# Patient Record
Sex: Female | Born: 1980
Health system: Southern US, Community
[De-identification: ages and names within clinical notes are randomized; demographics above are authoritative.]

## PROBLEM LIST (undated history)

## (undated) DIAGNOSIS — D229 Melanocytic nevi, unspecified: Secondary | ICD-10-CM

## (undated) DIAGNOSIS — Z8719 Personal history of other diseases of the digestive system: Secondary | ICD-10-CM

## (undated) DIAGNOSIS — Z8669 Personal history of other diseases of the nervous system and sense organs: Secondary | ICD-10-CM

## (undated) DIAGNOSIS — R112 Nausea with vomiting, unspecified: Secondary | ICD-10-CM

## (undated) DIAGNOSIS — Z9889 Other specified postprocedural states: Secondary | ICD-10-CM

## (undated) DIAGNOSIS — Z862 Personal history of diseases of the blood and blood-forming organs and certain disorders involving the immune mechanism: Secondary | ICD-10-CM

## (undated) DIAGNOSIS — K8050 Calculus of bile duct without cholangitis or cholecystitis without obstruction: Secondary | ICD-10-CM

## (undated) DIAGNOSIS — K219 Gastro-esophageal reflux disease without esophagitis: Secondary | ICD-10-CM

## (undated) DIAGNOSIS — Z87898 Personal history of other specified conditions: Secondary | ICD-10-CM

## (undated) HISTORY — PX: SHOULDER ARTHROSCOPY W/ LABRAL REPAIR: SHX2399

## (undated) HISTORY — DX: Gastro-esophageal reflux disease without esophagitis: K21.9

## (undated) HISTORY — PX: ABLATION: SHX5711

## (undated) HISTORY — PX: ARTHROSCOPY, HIP, WITH LABRUM REPAIR: SHX7156

## (undated) HISTORY — PX: BREAST SURGERY: SHX581

---
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
## 2000-02-07 DIAGNOSIS — K219 Gastro-esophageal reflux disease without esophagitis: Secondary | ICD-10-CM | POA: Insufficient documentation

## 2000-05-20 HISTORY — PX: TONSILLECTOMY: SUR1361

## 2000-06-09 ENCOUNTER — Other Ambulatory Visit: Admission: RE | Admit: 2000-06-09 | Discharge: 2000-06-09 | Payer: Self-pay | Admitting: *Deleted

## 2001-06-26 ENCOUNTER — Other Ambulatory Visit: Admission: RE | Admit: 2001-06-26 | Discharge: 2001-06-26 | Payer: Self-pay | Admitting: *Deleted

## 2002-07-08 ENCOUNTER — Other Ambulatory Visit: Admission: RE | Admit: 2002-07-08 | Discharge: 2002-07-08 | Payer: Self-pay | Admitting: Obstetrics and Gynecology

## 2003-03-31 ENCOUNTER — Encounter: Admission: RE | Admit: 2003-03-31 | Discharge: 2003-06-29 | Payer: Self-pay | Admitting: Orthopedic Surgery

## 2003-04-01 ENCOUNTER — Ambulatory Visit (HOSPITAL_COMMUNITY): Admission: RE | Admit: 2003-04-01 | Discharge: 2003-04-01 | Payer: Self-pay | Admitting: Orthopedic Surgery

## 2003-06-30 ENCOUNTER — Encounter: Admission: RE | Admit: 2003-06-30 | Discharge: 2003-09-28 | Payer: Self-pay | Admitting: Orthopedic Surgery

## 2003-07-19 ENCOUNTER — Other Ambulatory Visit: Admission: RE | Admit: 2003-07-19 | Discharge: 2003-07-19 | Payer: Self-pay | Admitting: Obstetrics and Gynecology

## 2003-10-04 ENCOUNTER — Encounter: Admission: RE | Admit: 2003-10-04 | Discharge: 2003-10-04 | Payer: Self-pay | Admitting: Orthopedic Surgery

## 2003-10-27 ENCOUNTER — Encounter: Admission: RE | Admit: 2003-10-27 | Discharge: 2003-11-16 | Payer: Self-pay | Admitting: Orthopedic Surgery

## 2005-08-31 ENCOUNTER — Ambulatory Visit (HOSPITAL_COMMUNITY): Admission: RE | Admit: 2005-08-31 | Discharge: 2005-08-31 | Payer: Self-pay | Admitting: Neurosurgery

## 2006-06-12 ENCOUNTER — Inpatient Hospital Stay (HOSPITAL_COMMUNITY): Admission: AD | Admit: 2006-06-12 | Discharge: 2006-06-12 | Payer: Self-pay | Admitting: Obstetrics and Gynecology

## 2006-06-28 ENCOUNTER — Inpatient Hospital Stay (HOSPITAL_COMMUNITY): Admission: AD | Admit: 2006-06-28 | Discharge: 2006-06-28 | Payer: Self-pay | Admitting: Obstetrics and Gynecology

## 2006-08-19 ENCOUNTER — Inpatient Hospital Stay (HOSPITAL_COMMUNITY): Admission: AD | Admit: 2006-08-19 | Discharge: 2006-08-21 | Payer: Self-pay | Admitting: Obstetrics and Gynecology

## 2008-01-26 ENCOUNTER — Emergency Department (HOSPITAL_COMMUNITY): Admission: EM | Admit: 2008-01-26 | Discharge: 2008-01-26 | Payer: Self-pay | Admitting: *Deleted

## 2008-02-04 ENCOUNTER — Ambulatory Visit (HOSPITAL_COMMUNITY): Admission: RE | Admit: 2008-02-04 | Discharge: 2008-02-04 | Payer: Self-pay | Admitting: Gastroenterology

## 2008-08-02 ENCOUNTER — Ambulatory Visit (HOSPITAL_COMMUNITY): Admission: RE | Admit: 2008-08-02 | Discharge: 2008-08-02 | Payer: Self-pay

## 2008-08-06 ENCOUNTER — Emergency Department (HOSPITAL_COMMUNITY): Admission: EM | Admit: 2008-08-06 | Discharge: 2008-08-06 | Payer: Self-pay | Admitting: Family Medicine

## 2009-02-12 ENCOUNTER — Inpatient Hospital Stay (HOSPITAL_COMMUNITY): Admission: AD | Admit: 2009-02-12 | Discharge: 2009-02-12 | Payer: Self-pay | Admitting: Obstetrics

## 2009-03-20 ENCOUNTER — Ambulatory Visit: Payer: Self-pay | Admitting: Oncology

## 2009-03-21 LAB — CHCC SMEAR

## 2009-03-21 LAB — COMPREHENSIVE METABOLIC PANEL
ALT: 9 U/L (ref 0–35)
AST: 14 U/L (ref 0–37)
Albumin: 3.8 g/dL (ref 3.5–5.2)
Alkaline Phosphatase: 108 U/L (ref 39–117)
BUN: 7 mg/dL (ref 6–23)
CO2: 19 mEq/L (ref 19–32)
Calcium: 8.4 mg/dL (ref 8.4–10.5)
Chloride: 105 mEq/L (ref 96–112)
Creatinine, Ser: 0.6 mg/dL (ref 0.40–1.20)
Glucose, Bld: 81 mg/dL (ref 70–99)
Potassium: 3.6 mEq/L (ref 3.5–5.3)
Sodium: 137 mEq/L (ref 135–145)
Total Bilirubin: 0.3 mg/dL (ref 0.3–1.2)
Total Protein: 6.4 g/dL (ref 6.0–8.3)

## 2009-03-21 LAB — CBC WITH DIFFERENTIAL/PLATELET
BASO%: 0.2 % (ref 0.0–2.0)
Basophils Absolute: 0 10*3/uL (ref 0.0–0.1)
EOS%: 0.8 % (ref 0.0–7.0)
Eosinophils Absolute: 0.1 10*3/uL (ref 0.0–0.5)
HCT: 34.9 % (ref 34.8–46.6)
HGB: 12.1 g/dL (ref 11.6–15.9)
LYMPH%: 22.2 % (ref 14.0–49.7)
MCH: 32.2 pg (ref 25.1–34.0)
MCHC: 34.6 g/dL (ref 31.5–36.0)
MCV: 93.2 fL (ref 79.5–101.0)
MONO#: 0.6 10*3/uL (ref 0.1–0.9)
MONO%: 6.1 % (ref 0.0–14.0)
NEUT#: 7.1 10*3/uL — ABNORMAL HIGH (ref 1.5–6.5)
NEUT%: 70.7 % (ref 38.4–76.8)
Platelets: 143 10*3/uL — ABNORMAL LOW (ref 145–400)
RBC: 3.74 10*6/uL (ref 3.70–5.45)
RDW: 13 % (ref 11.2–14.5)
WBC: 10 10*3/uL (ref 3.9–10.3)
lymph#: 2.2 10*3/uL (ref 0.9–3.3)

## 2009-03-21 LAB — APTT: aPTT: 26 seconds (ref 24–37)

## 2009-03-21 LAB — LACTATE DEHYDROGENASE: LDH: 158 U/L (ref 94–250)

## 2009-03-21 LAB — PROTIME-INR
INR: 0.9 — ABNORMAL LOW (ref 2.00–3.50)
Protime: 10.8 Seconds (ref 10.6–13.4)

## 2009-03-21 LAB — TECHNOLOGIST REVIEW

## 2009-03-22 LAB — ANA: Anti Nuclear Antibody(ANA): NEGATIVE

## 2009-04-12 LAB — CBC WITH DIFFERENTIAL/PLATELET
BASO%: 0.2 % (ref 0.0–2.0)
Basophils Absolute: 0 10*3/uL (ref 0.0–0.1)
EOS%: 1.9 % (ref 0.0–7.0)
Eosinophils Absolute: 0.2 10*3/uL (ref 0.0–0.5)
HCT: 33.6 % — ABNORMAL LOW (ref 34.8–46.6)
HGB: 11.5 g/dL — ABNORMAL LOW (ref 11.6–15.9)
LYMPH%: 31.1 % (ref 14.0–49.7)
MCH: 31.2 pg (ref 25.1–34.0)
MCHC: 34.2 g/dL (ref 31.5–36.0)
MCV: 91.1 fL (ref 79.5–101.0)
MONO#: 1 10*3/uL — ABNORMAL HIGH (ref 0.1–0.9)
MONO%: 8 % (ref 0.0–14.0)
NEUT#: 7.3 10*3/uL — ABNORMAL HIGH (ref 1.5–6.5)
NEUT%: 58.8 % (ref 38.4–76.8)
Platelets: 110 10*3/uL — ABNORMAL LOW (ref 145–400)
RBC: 3.69 10*6/uL — ABNORMAL LOW (ref 3.70–5.45)
RDW: 13.3 % (ref 11.2–14.5)
WBC: 12.3 10*3/uL — ABNORMAL HIGH (ref 3.9–10.3)
lymph#: 3.8 10*3/uL — ABNORMAL HIGH (ref 0.9–3.3)

## 2009-04-12 LAB — TECHNOLOGIST REVIEW

## 2009-04-20 ENCOUNTER — Ambulatory Visit: Payer: Self-pay | Admitting: Oncology

## 2009-04-24 LAB — CBC WITH DIFFERENTIAL/PLATELET
BASO%: 0.2 % (ref 0.0–2.0)
Basophils Absolute: 0 10*3/uL (ref 0.0–0.1)
EOS%: 1.1 % (ref 0.0–7.0)
Eosinophils Absolute: 0.1 10*3/uL (ref 0.0–0.5)
HCT: 36 % (ref 34.8–46.6)
HGB: 12.2 g/dL (ref 11.6–15.9)
LYMPH%: 21.7 % (ref 14.0–49.7)
MCH: 31.3 pg (ref 25.1–34.0)
MCHC: 33.9 g/dL (ref 31.5–36.0)
MCV: 92.3 fL (ref 79.5–101.0)
MONO#: 0.7 10*3/uL (ref 0.1–0.9)
MONO%: 6.3 % (ref 0.0–14.0)
NEUT#: 8.1 10*3/uL — ABNORMAL HIGH (ref 1.5–6.5)
NEUT%: 70.7 % (ref 38.4–76.8)
Platelets: 80 10*3/uL — ABNORMAL LOW (ref 145–400)
RBC: 3.9 10*6/uL (ref 3.70–5.45)
RDW: 13.6 % (ref 11.2–14.5)
WBC: 11.5 10*3/uL — ABNORMAL HIGH (ref 3.9–10.3)
lymph#: 2.5 10*3/uL (ref 0.9–3.3)
nRBC: 0 % (ref 0–0)

## 2009-05-02 LAB — BASIC METABOLIC PANEL
BUN: 9 mg/dL (ref 6–23)
CO2: 22 mEq/L (ref 19–32)
Calcium: 8.9 mg/dL (ref 8.4–10.5)
Chloride: 103 mEq/L (ref 96–112)
Creatinine, Ser: 0.66 mg/dL (ref 0.40–1.20)
Glucose, Bld: 112 mg/dL — ABNORMAL HIGH (ref 70–99)
Potassium: 3.9 mEq/L (ref 3.5–5.3)
Sodium: 136 mEq/L (ref 135–145)

## 2009-05-02 LAB — CBC WITH DIFFERENTIAL/PLATELET
BASO%: 0.2 % (ref 0.0–2.0)
Basophils Absolute: 0 10*3/uL (ref 0.0–0.1)
EOS%: 0.4 % (ref 0.0–7.0)
Eosinophils Absolute: 0.1 10*3/uL (ref 0.0–0.5)
HCT: 35.1 % (ref 34.8–46.6)
HGB: 12 g/dL (ref 11.6–15.9)
LYMPH%: 16.1 % (ref 14.0–49.7)
MCH: 32.3 pg (ref 25.1–34.0)
MCHC: 34.2 g/dL (ref 31.5–36.0)
MCV: 94.6 fL (ref 79.5–101.0)
MONO#: 1.2 10*3/uL — ABNORMAL HIGH (ref 0.1–0.9)
MONO%: 7.9 % (ref 0.0–14.0)
NEUT#: 11.6 10*3/uL — ABNORMAL HIGH (ref 1.5–6.5)
NEUT%: 75.4 % (ref 38.4–76.8)
Platelets: 157 10*3/uL (ref 145–400)
RBC: 3.71 10*6/uL (ref 3.70–5.45)
RDW: 13.3 % (ref 11.2–14.5)
WBC: 15.3 10*3/uL — ABNORMAL HIGH (ref 3.9–10.3)
lymph#: 2.5 10*3/uL (ref 0.9–3.3)

## 2009-05-08 LAB — CBC WITH DIFFERENTIAL/PLATELET
BASO%: 0.2 % (ref 0.0–2.0)
Basophils Absolute: 0 10*3/uL (ref 0.0–0.1)
EOS%: 0.3 % (ref 0.0–7.0)
Eosinophils Absolute: 0 10*3/uL (ref 0.0–0.5)
HCT: 40.5 % (ref 34.8–46.6)
HGB: 13.9 g/dL (ref 11.6–15.9)
LYMPH%: 15 % (ref 14.0–49.7)
MCH: 32.4 pg (ref 25.1–34.0)
MCHC: 34.3 g/dL (ref 31.5–36.0)
MCV: 94.5 fL (ref 79.5–101.0)
MONO#: 0.6 10*3/uL (ref 0.1–0.9)
MONO%: 4.9 % (ref 0.0–14.0)
NEUT#: 9.8 10*3/uL — ABNORMAL HIGH (ref 1.5–6.5)
NEUT%: 79.6 % — ABNORMAL HIGH (ref 38.4–76.8)
Platelets: 181 10*3/uL (ref 145–400)
RBC: 4.29 10*6/uL (ref 3.70–5.45)
RDW: 13.6 % (ref 11.2–14.5)
WBC: 12.3 10*3/uL — ABNORMAL HIGH (ref 3.9–10.3)
lymph#: 1.8 10*3/uL (ref 0.9–3.3)

## 2009-05-08 LAB — BASIC METABOLIC PANEL
BUN: 10 mg/dL (ref 6–23)
CO2: 22 mEq/L (ref 19–32)
Calcium: 9.5 mg/dL (ref 8.4–10.5)
Chloride: 101 mEq/L (ref 96–112)
Creatinine, Ser: 0.77 mg/dL (ref 0.40–1.20)
Glucose, Bld: 123 mg/dL — ABNORMAL HIGH (ref 70–99)
Potassium: 4 mEq/L (ref 3.5–5.3)
Sodium: 135 mEq/L (ref 135–145)

## 2009-05-17 LAB — CBC WITH DIFFERENTIAL/PLATELET
BASO%: 0.2 % (ref 0.0–2.0)
Basophils Absolute: 0 10*3/uL (ref 0.0–0.1)
EOS%: 0.6 % (ref 0.0–7.0)
Eosinophils Absolute: 0.1 10*3/uL (ref 0.0–0.5)
HCT: 41.5 % (ref 34.8–46.6)
HGB: 14.3 g/dL (ref 11.6–15.9)
LYMPH%: 21.5 % (ref 14.0–49.7)
MCH: 32.5 pg (ref 25.1–34.0)
MCHC: 34.4 g/dL (ref 31.5–36.0)
MCV: 94.5 fL (ref 79.5–101.0)
MONO#: 0.5 10*3/uL (ref 0.1–0.9)
MONO%: 5.2 % (ref 0.0–14.0)
NEUT#: 7.2 10*3/uL — ABNORMAL HIGH (ref 1.5–6.5)
NEUT%: 72.5 % (ref 38.4–76.8)
Platelets: 159 10*3/uL (ref 145–400)
RBC: 4.39 10*6/uL (ref 3.70–5.45)
RDW: 13.8 % (ref 11.2–14.5)
WBC: 9.9 10*3/uL (ref 3.9–10.3)
lymph#: 2.1 10*3/uL (ref 0.9–3.3)

## 2009-05-17 LAB — BASIC METABOLIC PANEL
BUN: 11 mg/dL (ref 6–23)
CO2: 19 mEq/L (ref 19–32)
Calcium: 9 mg/dL (ref 8.4–10.5)
Chloride: 103 mEq/L (ref 96–112)
Creatinine, Ser: 0.71 mg/dL (ref 0.40–1.20)
Glucose, Bld: 100 mg/dL — ABNORMAL HIGH (ref 70–99)
Potassium: 4.2 mEq/L (ref 3.5–5.3)
Sodium: 137 mEq/L (ref 135–145)

## 2009-05-18 ENCOUNTER — Inpatient Hospital Stay (HOSPITAL_COMMUNITY): Admission: RE | Admit: 2009-05-18 | Discharge: 2009-05-19 | Payer: Self-pay | Admitting: Obstetrics and Gynecology

## 2009-05-22 ENCOUNTER — Ambulatory Visit: Payer: Self-pay | Admitting: Oncology

## 2009-05-22 LAB — CBC WITH DIFFERENTIAL/PLATELET
BASO%: 0.1 % (ref 0.0–2.0)
Basophils Absolute: 0 10*3/uL (ref 0.0–0.1)
EOS%: 0.7 % (ref 0.0–7.0)
Eosinophils Absolute: 0.1 10*3/uL (ref 0.0–0.5)
HCT: 40.5 % (ref 34.8–46.6)
HGB: 13.8 g/dL (ref 11.6–15.9)
LYMPH%: 16.8 % (ref 14.0–49.7)
MCH: 31.7 pg (ref 25.1–34.0)
MCHC: 34.1 g/dL (ref 31.5–36.0)
MCV: 92.9 fL (ref 79.5–101.0)
MONO#: 0.5 10*3/uL (ref 0.1–0.9)
MONO%: 5.4 % (ref 0.0–14.0)
NEUT#: 7.8 10*3/uL — ABNORMAL HIGH (ref 1.5–6.5)
NEUT%: 77 % — ABNORMAL HIGH (ref 38.4–76.8)
Platelets: 119 10*3/uL — ABNORMAL LOW (ref 145–400)
RBC: 4.36 10*6/uL (ref 3.70–5.45)
RDW: 13.6 % (ref 11.2–14.5)
WBC: 10.1 10*3/uL (ref 3.9–10.3)
lymph#: 1.7 10*3/uL (ref 0.9–3.3)

## 2009-06-05 LAB — CBC WITH DIFFERENTIAL/PLATELET
BASO%: 0.7 % (ref 0.0–2.0)
Basophils Absolute: 0.1 10*3/uL (ref 0.0–0.1)
EOS%: 2.2 % (ref 0.0–7.0)
Eosinophils Absolute: 0.2 10*3/uL (ref 0.0–0.5)
HCT: 44.7 % (ref 34.8–46.6)
HGB: 14.4 g/dL (ref 11.6–15.9)
LYMPH%: 47.4 % (ref 14.0–49.7)
MCH: 30.8 pg (ref 25.1–34.0)
MCHC: 32.2 g/dL (ref 31.5–36.0)
MCV: 95.5 fL (ref 79.5–101.0)
MONO#: 0.6 10*3/uL (ref 0.1–0.9)
MONO%: 7 % (ref 0.0–14.0)
NEUT#: 3.9 10*3/uL (ref 1.5–6.5)
NEUT%: 42.7 % (ref 38.4–76.8)
Platelets: 131 10*3/uL — ABNORMAL LOW (ref 145–400)
RBC: 4.68 10*6/uL (ref 3.70–5.45)
RDW: 13 % (ref 11.2–14.5)
WBC: 9.2 10*3/uL (ref 3.9–10.3)
lymph#: 4.4 10*3/uL — ABNORMAL HIGH (ref 0.9–3.3)
nRBC: 0 % (ref 0–0)

## 2009-06-12 LAB — CBC WITH DIFFERENTIAL/PLATELET
BASO%: 0.2 % (ref 0.0–2.0)
Basophils Absolute: 0 10*3/uL (ref 0.0–0.1)
EOS%: 1.2 % (ref 0.0–7.0)
Eosinophils Absolute: 0.2 10*3/uL (ref 0.0–0.5)
HCT: 46.5 % (ref 34.8–46.6)
HGB: 15.2 g/dL (ref 11.6–15.9)
LYMPH%: 31.5 % (ref 14.0–49.7)
MCH: 31 pg (ref 25.1–34.0)
MCHC: 32.7 g/dL (ref 31.5–36.0)
MCV: 94.7 fL (ref 79.5–101.0)
MONO#: 1 10*3/uL — ABNORMAL HIGH (ref 0.1–0.9)
MONO%: 7.3 % (ref 0.0–14.0)
NEUT#: 8 10*3/uL — ABNORMAL HIGH (ref 1.5–6.5)
NEUT%: 59.8 % (ref 38.4–76.8)
Platelets: 143 10*3/uL — ABNORMAL LOW (ref 145–400)
RBC: 4.91 10*6/uL (ref 3.70–5.45)
RDW: 12.7 % (ref 11.2–14.5)
WBC: 13.3 10*3/uL — ABNORMAL HIGH (ref 3.9–10.3)
lymph#: 4.2 10*3/uL — ABNORMAL HIGH (ref 0.9–3.3)

## 2009-06-22 ENCOUNTER — Ambulatory Visit: Payer: Self-pay | Admitting: Oncology

## 2010-08-20 LAB — CBC
HCT: 40.7 % (ref 36.0–46.0)
HCT: 42.1 % (ref 36.0–46.0)
Hemoglobin: 13.7 g/dL (ref 12.0–15.0)
Hemoglobin: 14.1 g/dL (ref 12.0–15.0)
MCHC: 33.4 g/dL (ref 30.0–36.0)
MCHC: 33.6 g/dL (ref 30.0–36.0)
MCV: 94.8 fL (ref 78.0–100.0)
MCV: 95.8 fL (ref 78.0–100.0)
Platelets: 112 10*3/uL — ABNORMAL LOW (ref 150–400)
Platelets: 157 10*3/uL (ref 150–400)
RBC: 4.25 MIL/uL (ref 3.87–5.11)
RBC: 4.44 MIL/uL (ref 3.87–5.11)
RDW: 13.7 % (ref 11.5–15.5)
RDW: 13.9 % (ref 11.5–15.5)
WBC: 10.6 10*3/uL — ABNORMAL HIGH (ref 4.0–10.5)
WBC: 12.7 10*3/uL — ABNORMAL HIGH (ref 4.0–10.5)

## 2010-08-20 LAB — RPR: RPR Ser Ql: NONREACTIVE

## 2010-08-24 LAB — URINALYSIS, ROUTINE W REFLEX MICROSCOPIC
Bilirubin Urine: NEGATIVE
Glucose, UA: NEGATIVE mg/dL
Ketones, ur: NEGATIVE mg/dL
Leukocytes, UA: NEGATIVE
Nitrite: NEGATIVE
Protein, ur: NEGATIVE mg/dL
Specific Gravity, Urine: <1.005 — ABNORMAL LOW (ref 1.005–1.030)
Urobilinogen, UA: 0.2 mg/dL (ref 0.0–1.0)
pH: 5.5 (ref 5.0–8.0)

## 2010-08-24 LAB — URINE MICROSCOPIC-ADD ON
RBC / HPF: NONE SEEN RBC/hpf (ref <3)
WBC, UA: NONE SEEN WBC/hpf (ref <3)

## 2010-08-30 LAB — POCT URINALYSIS DIP (DEVICE)
Bilirubin Urine: NEGATIVE
Glucose, UA: NEGATIVE mg/dL
Hgb urine dipstick: NEGATIVE
Ketones, ur: NEGATIVE mg/dL
Nitrite: NEGATIVE
Protein, ur: NEGATIVE mg/dL
Specific Gravity, Urine: <=1.005 (ref 1.005–1.030)
Urobilinogen, UA: 0.2 mg/dL (ref 0.0–1.0)
pH: 6 (ref 5.0–8.0)

## 2010-08-30 LAB — POCT PREGNANCY, URINE: Preg Test, Ur: NEGATIVE

## 2010-10-02 NOTE — Op Note (Signed)
Rebecca Pearson, Rebecca Pearson               ACCOUNT NO.:  [id number]   MEDICAL RECORD NO.:  [id number]          PATIENT TYPE:  AMB   LOCATION:  ENDO                         FACILITY:  [facility]   PHYSICIAN:  Bernette Redbird, M.D.   DATE OF BIRTH:  06-16-1980   DATE OF PROCEDURE:  02/04/2008  DATE OF DISCHARGE:                               OPERATIVE REPORT   PROCEDURE:  Upper endoscopy with Bravo capsule placement.   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  Because the character of the  symptoms is atypical for GERD, this procedure is being done with her on  Protonix to look for evidence of breakthrough reflux as a possible cause  of her symptoms.   FINDINGS:  Moderate retained food despite 9-hour fast.  Successful  deployment of Bravo capsule.   DESCRIPTION OF PROCEDURE:  The nature, purpose and risks of the  procedure had been discussed with the patient who provided written  consent.  She came as an outpatient to the endoscopy unit.  She had been  fasting for 9 hours prior to the procedure and worked the night shift  prior to this examination.   Sedation was fentanyl 100 mcg, Versed 10 mg and Cetacaine spray for  topical pharyngeal anesthesia.  The intravenous sedation was  administered prior to and during the course of the procedure and did not  result in any clinical instability such as desaturation or arrhythmias.   The Pentax adult video endoscope was passed under direct vision.  The  larynx and vocal cords looked normal.  The esophagus was readily  entered.   The esophageal mucosa was entirely normal.  I did not see any free  reflux, reflux esophagitis, Barrett's esophagus, varices, infection,  neoplasia or any ring or stricture.   A minimal (1-2 cm) hiatal hernia was present.   The stomach was entered.  It was notable for a moderate amount of  retained food, including vegetable debris such as chili beans and onion  skins.  It is estimated that the amount of retained food was probably on  the order of a quarter of a cup or perhaps half a cup; that is, the  stomach was not filled with food.   The gastric mucosa was normal in all locations visualized, including a  retroflexed view of the cardia, but the retained food did obscure  portions of the greater curve, so focal lesions might have been missed.   The pylorus was widely patent and the duodenal bulb and the second  duodenum looked normal.   Bravo capsule was then performed.  The lower esophageal sphincter was  measured at 39 cm, so the Bravo capsule was placed at 33 cm.  After  removing the scope, the Bravo capsule deployment device was passed  blindly into the esophagus and its intra-esophageal location was  confirmed endoscopically.  Suction was then applied for about 40 seconds  and the capsule was successfully deployed.  The patient was re-  endoscoped  under direct vision, confirming successful attachment of the  capsule to the esophageal wall.  The scope was then removed from the  patient who tolerated the procedure well and without apparent  complication.   IMPRESSION:  1. Reflux, without any adverse mucosal sequelae or significant      anatomic abnormalities to go along with it.  Specifically, no      reflux esophagitis, Barrett's or large hiatal hernia.  2. Moderate amount of retained food in the stomach despite a 9-hour      fast prior to this procedure, raising the question of a possible      element of delayed gastric emptying.  3. Successful deployment of Bravo capsule.   PLAN:  Await Bravo results (on Protonix).  Consider gastric emptying  scan.           ______________________________  Bernette Redbird, M.D.     RB/MEDQ  D:  02/04/2008  T:  02/05/2008  Job:  [id number]   cc:   Selinda Flavin  Fax: [phone]

## 2010-10-05 NOTE — H&P (Signed)
NAMEJANNE, Rebecca Pearson               ACCOUNT NO.:  [id number]   MEDICAL RECORD NO.:  [id number]          PATIENT TYPE:  INP   LOCATION:  9171                          FACILITY:  [facility]   PHYSICIAN:  Lenoard Aden, M.D.DATE OF BIRTH:  1980/12/29   DATE OF ADMISSION:  08/19/2006  DATE OF DISCHARGE:                              HISTORY & PHYSICAL   CHIEF COMPLAINT:  Labor.   She is a 30 year old white female, G3, P0, at 39-5/7 weeks with  increased frequency of contractions today.  She presented in the office  this morning for routine checkup; 2-3 cm, now increasingly uncomfortable  with contractions every 2-3 minutes.  She reports good fetal movement.   ALLERGIES:  No known drug allergies.   MEDICATIONS:  Prenatal vitamins and Protonix.   SOCIAL HISTORY:  She is a nonsmoker, nondrinker.  Denies domestic  physical violence.   MEDICAL PROBLEMS:  To include history of abnormal Pap smear, two induced  abortions, gastroesophageal reflux, and migraine headaches.   PHYSICAL EXAMINATION:  GENERAL:  She is an uncomfortable-appearing white  female in no acute distress.  HEENT:  Normal.  LUNGS:  Clear.  HEART:  Regular rate and rhythm.  ABDOMEN:  Soft, gravid, nontender.  Estimated fetal weight is 7-1/2 to 8  pounds.  PELVIC:  Cervix is 5-6 cm, 100%, vertex, 0 station.  Amniotomy clear  fluid.  EXTREMITIES:  No cords.  NEUROLOGIC:  Exam is nonfocal.   IMPRESSION:  1. Term intrauterine pregnancy.  2. Group B Streptococcus negative.  3. History of migraines.   PLAN:  1. Anticipate vaginal delivery.  2. Proceed with epidural.     Lenoard Aden, M.D.  Electronically Signed    RJT/MEDQ  D:  08/19/2006  T:  08/19/2006  Job:  [id number]

## 2010-10-05 NOTE — Op Note (Signed)
Rebecca Pearson, Rebecca Pearson               ACCOUNT NO.:  [id number]   MEDICAL RECORD NO.:  [id number]          PATIENT TYPE:  AMB   LOCATION:  ENDO                         FACILITY:  [facility]   PHYSICIAN:  Bernette Redbird, M.D.   DATE OF BIRTH:  05-Feb-1981   DATE OF PROCEDURE:  02/05/2008  DATE OF DISCHARGE:  02/04/2008                               OPERATIVE REPORT   This is 48-hour ambulatory esophageal pH monitoring.   INDICATION:  A 30 year old female with refractory GERD symptoms despite  PPI therapy.   FINDINGS:  Breakthrough reflux occurring.   DESCRIPTION OF PROCEDURE:  The nature, purpose and risks of the  procedure had been discussed with the patient who provided written  consent.  The Bravo capsule was placed endoscopically and monitoring was  performed for 48 hours after which the patient turned in the monitoring  equipment and it was downloaded.   FINDINGS:  The first day DeMeester was slightly abnormal at 18.7 (normal  up to 14.7), with predominantly daytime/upright reflux.  There were  three episodes lasting more than 5 minutes and 4.1% of the time was with  a pH less than 4.  Although the number of reflux symptom episodes was  greater during the upright posture, the longest reflux, lasting 12  minutes, was while she was supine.   On the second day of pH monitoring, the DeMeester score was in the  normal range at 11.8 (normal up to 14.7).  During this period of time,  the reflux was almost entirely while the patient was upright and there  were two episodes lasting more than 5 minutes, one of them 9 minutes  long.   The pooled data for the 2 days showed the overall reflux fractional time  to be 3.8%, normal being up to 5.3%, thus within normal limits for the  entire interval.   IMPRESSION:  Breakthrough reflux occurring despite Protonix therapy.   PLAN:  These findings will be discussed with the patient when she comes  into the office in the near future.  The fact that  her endoscopy showed  retained food despite a 9-hour fast raises question of impaired gastric  emptying as a possible contributor to the patient's reflux problem.           ______________________________  Bernette Redbird, M.D.     RB/MEDQ  D:  02/09/2008  T:  02/10/2008  Job:  [id number]   cc:   Selinda Flavin  Fax: [phone]

## 2011-01-11 ENCOUNTER — Inpatient Hospital Stay (INDEPENDENT_AMBULATORY_CARE_PROVIDER_SITE_OTHER)
Admission: RE | Admit: 2011-01-11 | Discharge: 2011-01-11 | Disposition: A | Discharge status: Home / Self Care | Payer: BC Managed Care – PPO | Source: Ambulatory Visit | Attending: Family Medicine | Admitting: Family Medicine

## 2011-01-11 DIAGNOSIS — J019 Acute sinusitis, unspecified: Secondary | ICD-10-CM

## 2011-01-11 DIAGNOSIS — H109 Unspecified conjunctivitis: Secondary | ICD-10-CM

## 2011-01-11 LAB — POCT RAPID STREP A: Streptococcus, Group A Screen (Direct): NEGATIVE

## 2012-07-31 DIAGNOSIS — D229 Melanocytic nevi, unspecified: Secondary | ICD-10-CM

## 2012-07-31 HISTORY — DX: Melanocytic nevi, unspecified: D22.9

## 2014-03-17 ENCOUNTER — Ambulatory Visit: Payer: Self-pay | Admitting: Internal Medicine

## 2014-03-18 ENCOUNTER — Encounter: Payer: Self-pay | Admitting: *Deleted

## 2014-03-22 ENCOUNTER — Encounter: Payer: Self-pay | Admitting: Internal Medicine

## 2014-03-22 ENCOUNTER — Ambulatory Visit (INDEPENDENT_AMBULATORY_CARE_PROVIDER_SITE_OTHER): Payer: BC Managed Care – PPO | Admitting: Internal Medicine

## 2014-03-22 VITALS — BP 100/64 | HR 70 | Ht 66.0 in | Wt 147.2 lb

## 2014-03-22 DIAGNOSIS — R002 Palpitations: Secondary | ICD-10-CM

## 2014-03-22 DIAGNOSIS — G43009 Migraine without aura, not intractable, without status migrainosus: Secondary | ICD-10-CM

## 2014-03-22 DIAGNOSIS — I471 Supraventricular tachycardia: Secondary | ICD-10-CM

## 2014-03-22 NOTE — Patient Instructions (Signed)
Your physician recommends that you continue on your current medications as directed. Please refer to the Current Medication list given to you today.  Your physician wants you to follow-up in: 1 year with Dr. Lovena Le in [place].  You will receive a reminder letter in the mail two months in advance. If you don't receive a letter, please call our office to schedule the follow-up appointment.

## 2014-03-22 NOTE — Progress Notes (Signed)
ELECTROPHYSIOLOGY CONSULT NOTE  Patient ID: Rebecca Pearson, MRN: [id number], DOB/AGE: 1980-07-01 33 y.o. Admit date: (Not on file) Date of Consult: 03/22/2014  Primary Physician: No primary care provider on file. 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 They have been unassociated with activity for the most part. Bending has been infrequent trigger. They are frog negative, diuretic negative, associated with some lightheadedness but no presyncope. There is mild chest discomfort without pain and no significant dyspnea. Duration is typically been in the range of 1-2 minutes. Most recently had an episode that lasted about 30 minutes but almost prompted her to call 911.  She has no exercise intolerance Rebecca Pearson this patient is being transferred from [facility] for tachybradycardia syndrome possible atrial flutter and questions of post termination bradycardia.   Past Medical History  Diagnosis Date  . Palpitations   . Headache   . Tachycardia   . Gastroesophageal reflux disease       Surgical History:  Past Surgical History  Procedure Laterality Date  . Tonsillectomy       Home Meds: Prior to Admission medications   Medication Sig Start Date End Date Taking? Authorizing Provider  Albuterol (VENTOLIN IN) Inhale 90 mg into the lungs as needed.   Yes Historical Provider, MD  ALPRAZolam Duanne Moron) 0.5 MG tablet Take 0.5 mg by mouth at bedtime as needed for anxiety.   Yes Historical Provider, MD  norgestimate-ethinyl estradiol (ORTHO-CYCLEN,SPRINTEC,PREVIFEM) 0.25-35 MG-MCG tablet Take 1 tablet by mouth daily.   Yes Historical Provider, MD  pantoprazole (PROTONIX) 40 MG tablet Take 40 mg by mouth daily.   Yes Historical Provider, MD  propranolol (INDERAL) 60 MG tablet Take 60  mg by mouth daily.   Yes Historical Provider, MD  ranitidine (ZANTAC) 300 MG tablet Take 300 mg by mouth at bedtime.   Yes Historical Provider, MD  rizatriptan (MAXALT) 5 MG tablet Take 5 mg by mouth as needed for migraine. May repeat in 2 hours if needed   Yes Historical Provider, MD     Allergies: No Known Allergies  History   Social History  . Marital Status: Married    Spouse Name: N/A    Number of Children: N/A  . Years of Education: N/A   Occupational History  . Not on file.   Social History Main Topics  . Smoking status: Light Tobacco Smoker  . Smokeless tobacco: Not on file  . Alcohol Use: Not on file  . Drug Use: Not on file  . Sexual Activity: Not on file   Other Topics Concern  . Not on file   Social History Narrative     Family History  Problem Relation Age of Onset  . Migraines Mother   . Hypothyroidism Mother   . Cancer Father     lung mets to brain, liver lympnode   . GER disease Father   . Irritable bowel syndrome Father   . GER disease Brother      ROS:  Please see the history of present illness.     All other systems reviewed and negative.    Physical Exam Blood pressure 100/64, pulse 70, height 5\' 6"  (1.676 m), weight 147 lb 3.2 oz (66.769 kg). Alert and oriented in no acute distress HENT- normal  Eyes- EOMI, without scleral icterus Skin- warm and dry; without rashes LN-neg Neck- supple without thyromegaly, JVP-flat, carotids brisk and full without bruits Back-without CVAT or kyphosis Lungs-clear to auscultation CV-Regular rate and rhythm, nl S1 and S2, no murmurs gallops or rubs, S4-absent Abd-soft with active bowel sounds; no midline pulsation or hepatomegaly Pulses-intact femoral and distal MKS-without gross deformity Neuro- Ax O, CN3-12 intact, grossly normal motor and sensory function Affect engaging     Labs: Cardiac Enzymes No results for input(s): CKTOTAL, CKMB, TROPONINI in the last 72 hours. CBC Lab Results  Component  Value Date   WBC 13.3* 06/12/2009   HGB 15.2 06/12/2009   HCT 46.5 06/12/2009   MCV 94.7 06/12/2009   PLT 143* 06/12/2009   PROTIME: No results for input(s): LABPROT, INR in the last 72 hours. Chemistry No results for input(s): NA, K, CL, CO2, BUN, CREATININE, CALCIUM, PROT, BILITOT, ALKPHOS, ALT, AST, GLUCOSE in the last 168 hours.  Invalid input(s): LABALBU Lipids No results found for: CHOL, HDL, LDLCALC, TRIG BNP No results found for: PROBNP Miscellaneous No results found for: DDIMER  Radiology/Studies:  No results found.  EKG: *NSR without preexcitation  Assessment and Plan:  SVT  Migraine headaches  She has recurrent abrupt onset offset tachypalpitations. 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 The symptoms are consistent with him being AV reentry as opposed AV nodal reentry not withstanding her gender. The unresolved concern is the description that she gave of her heart rate going from 150-172 being uncomfortable when she stands. This suggests the possibility of an atrial rhythm, i.e. Flutter with variable i.e. 2:1--1:1 conduction.  We have discussed treatment options including catheter ablation antiarrhythmic therapy and ongoing use of beta blockers which she is taking for her migraine headaches. She would like to continue on her current course of beta blockers. I will have her follow-up in one year with Dr. Harriet Masson. In [employer] [facility] [employer]. She will let us know if her symptoms worsen.    Virl Axe

## 2015-01-04 ENCOUNTER — Other Ambulatory Visit: Payer: Self-pay | Admitting: Orthopedic Surgery

## 2015-01-24 ENCOUNTER — Encounter (HOSPITAL_BASED_OUTPATIENT_CLINIC_OR_DEPARTMENT_OTHER): Payer: Self-pay | Admitting: *Deleted

## 2015-01-30 ENCOUNTER — Ambulatory Visit (HOSPITAL_BASED_OUTPATIENT_CLINIC_OR_DEPARTMENT_OTHER): Payer: BLUE CROSS/BLUE SHIELD | Admitting: Anesthesiology

## 2015-01-30 ENCOUNTER — Ambulatory Visit (HOSPITAL_BASED_OUTPATIENT_CLINIC_OR_DEPARTMENT_OTHER)
Admission: RE | Admit: 2015-01-30 | Discharge: 2015-01-30 | Disposition: A | Discharge status: Home / Self Care | Payer: BLUE CROSS/BLUE SHIELD | Source: Ambulatory Visit | Attending: Orthopedic Surgery | Admitting: Orthopedic Surgery

## 2015-01-30 ENCOUNTER — Encounter (HOSPITAL_BASED_OUTPATIENT_CLINIC_OR_DEPARTMENT_OTHER)
Admission: RE | Disposition: A | Discharge status: Home / Self Care | Payer: Self-pay | Source: Ambulatory Visit | Attending: Orthopedic Surgery

## 2015-01-30 ENCOUNTER — Encounter (HOSPITAL_BASED_OUTPATIENT_CLINIC_OR_DEPARTMENT_OTHER): Payer: Self-pay | Admitting: Anesthesiology

## 2015-01-30 DIAGNOSIS — S43431A Superior glenoid labrum lesion of right shoulder, initial encounter: Secondary | ICD-10-CM | POA: Diagnosis not present

## 2015-01-30 DIAGNOSIS — Y998 Other external cause status: Secondary | ICD-10-CM | POA: Insufficient documentation

## 2015-01-30 DIAGNOSIS — Y9289 Other specified places as the place of occurrence of the external cause: Secondary | ICD-10-CM | POA: Diagnosis not present

## 2015-01-30 DIAGNOSIS — X58XXXA Exposure to other specified factors, initial encounter: Secondary | ICD-10-CM | POA: Diagnosis not present

## 2015-01-30 DIAGNOSIS — G43909 Migraine, unspecified, not intractable, without status migrainosus: Secondary | ICD-10-CM | POA: Diagnosis not present

## 2015-01-30 DIAGNOSIS — M65811 Other synovitis and tenosynovitis, right shoulder: Secondary | ICD-10-CM | POA: Insufficient documentation

## 2015-01-30 DIAGNOSIS — K219 Gastro-esophageal reflux disease without esophagitis: Secondary | ICD-10-CM | POA: Insufficient documentation

## 2015-01-30 DIAGNOSIS — F17210 Nicotine dependence, cigarettes, uncomplicated: Secondary | ICD-10-CM | POA: Diagnosis not present

## 2015-01-30 DIAGNOSIS — Y9368 Activity, volleyball (beach) (court): Secondary | ICD-10-CM | POA: Insufficient documentation

## 2015-01-30 DIAGNOSIS — S43001A Unspecified subluxation of right shoulder joint, initial encounter: Secondary | ICD-10-CM | POA: Diagnosis present

## 2015-01-30 DIAGNOSIS — R002 Palpitations: Secondary | ICD-10-CM | POA: Insufficient documentation

## 2015-01-30 DIAGNOSIS — M25511 Pain in right shoulder: Secondary | ICD-10-CM | POA: Diagnosis not present

## 2015-01-30 HISTORY — DX: Personal history of diseases of the blood and blood-forming organs and certain disorders involving the immune mechanism: Z86.2

## 2015-01-30 HISTORY — DX: Nausea with vomiting, unspecified: R11.2

## 2015-01-30 HISTORY — DX: Other specified postprocedural states: Z98.890

## 2015-01-30 HISTORY — PX: SHOULDER ARTHROSCOPY WITH BICEPSTENOTOMY: SHX6204

## 2015-01-30 SURGERY — SHOULDER ARTHROSCOPY WITH BICEPS TENOTOMY
Anesthesia: Regional | Laterality: Right

## 2015-01-30 MED ORDER — MEPERIDINE HCL 25 MG/ML IJ SOLN: 6.2500 mg | INTRAMUSCULAR | Status: DC | PRN

## 2015-01-30 MED ORDER — PROMETHAZINE HCL 25 MG/ML IJ SOLN
INTRAMUSCULAR | Status: AC
  Filled 2015-01-30: qty 1

## 2015-01-30 MED ORDER — HYDROMORPHONE HCL 1 MG/ML IJ SOLN
INTRAMUSCULAR | Status: AC
  Filled 2015-01-30: qty 1

## 2015-01-30 MED ORDER — HYDROMORPHONE HCL 1 MG/ML IJ SOLN
0.2500 mg | INTRAMUSCULAR | Status: DC | PRN
  Administered 2015-01-30 (×3): 0.5 mg via INTRAVENOUS

## 2015-01-30 MED ORDER — CEFAZOLIN SODIUM-DEXTROSE 2-3 GM-% IV SOLR
2.0000 g | INTRAVENOUS | Status: AC
  Administered 2015-01-30: 2 g via INTRAVENOUS

## 2015-01-30 MED ORDER — PROMETHAZINE HCL 12.5 MG PO TABS: 12.5000 mg | ORAL_TABLET | Freq: Four times a day (QID) | ORAL | Status: DC | PRN

## 2015-01-30 MED ORDER — LIDOCAINE HCL (CARDIAC) 20 MG/ML IV SOLN
INTRAVENOUS | Status: AC
  Filled 2015-01-30: qty 5

## 2015-01-30 MED ORDER — CEFAZOLIN SODIUM-DEXTROSE 2-3 GM-% IV SOLR
INTRAVENOUS | Status: AC
  Filled 2015-01-30: qty 50

## 2015-01-30 MED ORDER — MIDAZOLAM HCL 2 MG/2ML IJ SOLN
INTRAMUSCULAR | Status: AC
  Filled 2015-01-30: qty 2

## 2015-01-30 MED ORDER — DEXAMETHASONE SODIUM PHOSPHATE 4 MG/ML IJ SOLN
INTRAMUSCULAR | Status: DC | PRN
  Administered 2015-01-30: 10 mg via INTRAVENOUS

## 2015-01-30 MED ORDER — SCOPOLAMINE 1 MG/3DAYS TD PT72
MEDICATED_PATCH | TRANSDERMAL | Status: AC
  Filled 2015-01-30: qty 1

## 2015-01-30 MED ORDER — DEXAMETHASONE SODIUM PHOSPHATE 10 MG/ML IJ SOLN
INTRAMUSCULAR | Status: AC
  Filled 2015-01-30: qty 1

## 2015-01-30 MED ORDER — PROPOFOL 10 MG/ML IV BOLUS
INTRAVENOUS | Status: AC
  Filled 2015-01-30: qty 20

## 2015-01-30 MED ORDER — OXYCODONE HCL 5 MG/5ML PO SOLN: 5.0000 mg | Freq: Once | ORAL | Status: DC | PRN

## 2015-01-30 MED ORDER — LACTATED RINGERS IV SOLN
INTRAVENOUS | Status: DC
  Administered 2015-01-30 (×2): via INTRAVENOUS

## 2015-01-30 MED ORDER — POVIDONE-IODINE 7.5 % EX SOLN: Freq: Once | CUTANEOUS | Status: DC

## 2015-01-30 MED ORDER — PROMETHAZINE HCL 25 MG/ML IJ SOLN
6.2500 mg | Freq: Once | INTRAMUSCULAR | Status: AC
  Administered 2015-01-30: 6.25 mg via INTRAVENOUS

## 2015-01-30 MED ORDER — LIDOCAINE HCL (CARDIAC) 20 MG/ML IV SOLN
INTRAVENOUS | Status: DC | PRN
  Administered 2015-01-30: 70 mg via INTRAVENOUS

## 2015-01-30 MED ORDER — OXYCODONE HCL 5 MG PO TABS: 5.0000 mg | ORAL_TABLET | Freq: Once | ORAL | Status: DC | PRN

## 2015-01-30 MED ORDER — SCOPOLAMINE 1 MG/3DAYS TD PT72
1.0000 | MEDICATED_PATCH | Freq: Once | TRANSDERMAL | Status: DC | PRN
  Administered 2015-01-30: 1.5 mg via TRANSDERMAL

## 2015-01-30 MED ORDER — GLYCOPYRROLATE 0.2 MG/ML IJ SOLN: 0.2000 mg | Freq: Once | INTRAMUSCULAR | Status: DC | PRN

## 2015-01-30 MED ORDER — ONDANSETRON HCL 4 MG/2ML IJ SOLN
INTRAMUSCULAR | Status: DC | PRN
  Administered 2015-01-30: 4 mg via INTRAVENOUS

## 2015-01-30 MED ORDER — SUCCINYLCHOLINE CHLORIDE 20 MG/ML IJ SOLN
INTRAMUSCULAR | Status: DC | PRN
  Administered 2015-01-30: 100 mg via INTRAVENOUS

## 2015-01-30 MED ORDER — OXYCODONE-ACETAMINOPHEN 5-325 MG PO TABS: 1.0000 | ORAL_TABLET | ORAL | Status: DC | PRN

## 2015-01-30 MED ORDER — ONDANSETRON HCL 4 MG/2ML IJ SOLN
INTRAMUSCULAR | Status: AC
  Filled 2015-01-30: qty 2

## 2015-01-30 MED ORDER — FENTANYL CITRATE (PF) 100 MCG/2ML IJ SOLN
INTRAMUSCULAR | Status: AC
  Filled 2015-01-30: qty 4

## 2015-01-30 MED ORDER — DOCUSATE SODIUM 100 MG PO CAPS: 100.0000 mg | ORAL_CAPSULE | Freq: Three times a day (TID) | ORAL | Status: DC | PRN

## 2015-01-30 MED ORDER — FENTANYL CITRATE (PF) 100 MCG/2ML IJ SOLN
50.0000 ug | INTRAMUSCULAR | Status: DC | PRN
Start: 2015-01-30 — End: 2015-01-30
  Administered 2015-01-30: 100 ug via INTRAVENOUS
  Administered 2015-01-30: 50 ug via INTRAVENOUS

## 2015-01-30 MED ORDER — BUPIVACAINE-EPINEPHRINE (PF) 0.5% -1:200000 IJ SOLN
INTRAMUSCULAR | Status: DC | PRN
  Administered 2015-01-30: 25 mL via PERINEURAL

## 2015-01-30 MED ORDER — PROPOFOL 10 MG/ML IV BOLUS
INTRAVENOUS | Status: DC | PRN
  Administered 2015-01-30: 200 mg via INTRAVENOUS

## 2015-01-30 MED ORDER — FENTANYL CITRATE (PF) 100 MCG/2ML IJ SOLN
INTRAMUSCULAR | Status: AC
  Filled 2015-01-30: qty 2

## 2015-01-30 MED ORDER — MIDAZOLAM HCL 2 MG/2ML IJ SOLN
1.0000 mg | INTRAMUSCULAR | Status: DC | PRN
  Administered 2015-01-30: 2 mg via INTRAVENOUS

## 2015-01-30 SURGICAL SUPPLY — 78 items
APL SKNCLS STERI-STRIP NONHPOA (GAUZE/BANDAGES/DRESSINGS)
BENZOIN TINCTURE PRP APPL 2/3 (GAUZE/BANDAGES/DRESSINGS) IMPLANT
BLADE CLIPPER SURG (BLADE) IMPLANT
BLADE SURG 15 STRL LF DISP TIS (BLADE) ×1 IMPLANT
BLADE SURG 15 STRL SS (BLADE) ×2
BLADE VORTEX 6.0 (BLADE) IMPLANT
BUR OVAL 4.0 (BURR) ×2 IMPLANT
CANNULA 5.75X71 LONG (CANNULA) ×2 IMPLANT
CANNULA TWIST IN 8.25X7CM (CANNULA) IMPLANT
CHLORAPREP W/TINT 26ML (MISCELLANEOUS) ×2 IMPLANT
DECANTER SPIKE VIAL GLASS SM (MISCELLANEOUS) IMPLANT
DRAPE INCISE IOBAN 66X45 STRL (DRAPES) ×2 IMPLANT
DRAPE STERI 35X30 U-POUCH (DRAPES) ×2 IMPLANT
DRAPE SURG 17X23 STRL (DRAPES) ×2 IMPLANT
DRAPE U 20/CS (DRAPES) ×2 IMPLANT
DRAPE U-SHAPE 47X51 STRL (DRAPES) ×2 IMPLANT
DRAPE U-SHAPE 76X120 STRL (DRAPES) ×4 IMPLANT
DRSG PAD ABDOMINAL 8X10 ST (GAUZE/BANDAGES/DRESSINGS) ×2 IMPLANT
ELECT REM PT RETURN 9FT ADLT (ELECTROSURGICAL) ×2
ELECTRODE REM PT RTRN 9FT ADLT (ELECTROSURGICAL) ×1 IMPLANT
GAUZE SPONGE 4X4 12PLY STRL (GAUZE/BANDAGES/DRESSINGS) ×2 IMPLANT
GAUZE SPONGE 4X4 16PLY XRAY LF (GAUZE/BANDAGES/DRESSINGS) IMPLANT
GAUZE XEROFORM 1X8 LF (GAUZE/BANDAGES/DRESSINGS) ×2 IMPLANT
GLOVE BIO SURGEON STRL SZ7 (GLOVE) ×4 IMPLANT
GLOVE BIO SURGEON STRL SZ7.5 (GLOVE) ×4 IMPLANT
GLOVE BIOGEL PI IND STRL 7.0 (GLOVE) ×2 IMPLANT
GLOVE BIOGEL PI IND STRL 8 (GLOVE) ×2 IMPLANT
GLOVE BIOGEL PI INDICATOR 7.0 (GLOVE) ×2
GLOVE BIOGEL PI INDICATOR 8 (GLOVE) ×2
GOWN STRL REUS W/ TWL LRG LVL3 (GOWN DISPOSABLE) ×3 IMPLANT
GOWN STRL REUS W/TWL LRG LVL3 (GOWN DISPOSABLE) ×6
LASSO CRESCENT QUICKPASS (SUTURE) ×2 IMPLANT
LIQUID BAND (GAUZE/BANDAGES/DRESSINGS) IMPLANT
MANIFOLD NEPTUNE II (INSTRUMENTS) ×2 IMPLANT
NDL SUT 6 .5 CRC .975X.05 MAYO (NEEDLE) IMPLANT
NEEDLE 1/2 CIR CATGUT .05X1.09 (NEEDLE) IMPLANT
NEEDLE MAYO TAPER (NEEDLE)
NEEDLE SCORPION MULTI FIRE (NEEDLE) IMPLANT
NS IRRIG 1000ML POUR BTL (IV SOLUTION) IMPLANT
PACK ARTHROSCOPY DSU (CUSTOM PROCEDURE TRAY) ×2 IMPLANT
PACK BASIN DAY SURGERY FS (CUSTOM PROCEDURE TRAY) ×2 IMPLANT
PENCIL BUTTON HOLSTER BLD 10FT (ELECTRODE) ×2 IMPLANT
RESECTOR FULL RADIUS 4.2MM (BLADE) ×2 IMPLANT
SLEEVE SCD COMPRESS KNEE MED (MISCELLANEOUS) ×2 IMPLANT
SLING ARM IMMOBILIZER MED (SOFTGOODS) IMPLANT
SLING ARM LRG ADULT FOAM STRAP (SOFTGOODS) IMPLANT
SLING ARM MED ADULT FOAM STRAP (SOFTGOODS) IMPLANT
SLING ARM XL FOAM STRAP (SOFTGOODS) IMPLANT
SPONGE LAP 4X18 X RAY DECT (DISPOSABLE) ×2 IMPLANT
STRIP CLOSURE SKIN 1/2X4 (GAUZE/BANDAGES/DRESSINGS) IMPLANT
SUCTION FRAZIER TIP 10 FR DISP (SUCTIONS) ×2 IMPLANT
SUPPORT WRAP ARM LG (MISCELLANEOUS) ×2 IMPLANT
SUT 2 FIBERLOOP 20 STRT BLUE (SUTURE) ×2
SUT BONE WAX W31G (SUTURE) IMPLANT
SUT ETHIBOND 2 OS 4 DA (SUTURE) IMPLANT
SUT ETHILON 3 0 PS 1 (SUTURE) ×2 IMPLANT
SUT ETHILON 4 0 PS 2 18 (SUTURE) IMPLANT
SUT FIBERWIRE #2 38 T-5 BLUE (SUTURE)
SUT MNCRL AB 3-0 PS2 18 (SUTURE) IMPLANT
SUT MNCRL AB 4-0 PS2 18 (SUTURE) IMPLANT
SUT PDS AB 0 CT 36 (SUTURE) IMPLANT
SUT PROLENE 3 0 PS 2 (SUTURE) IMPLANT
SUT TIGER TAPE 7 IN WHITE (SUTURE) IMPLANT
SUT VIC AB 0 CT1 27 (SUTURE)
SUT VIC AB 0 CT1 27XBRD ANBCTR (SUTURE) IMPLANT
SUT VIC AB 2-0 SH 27 (SUTURE)
SUT VIC AB 2-0 SH 27XBRD (SUTURE) IMPLANT
SUTURE 2 FIBERLOOP 20 STRT BLU (SUTURE) ×1 IMPLANT
SUTURE FIBERWR #2 38 T-5 BLUE (SUTURE) IMPLANT
SYR BULB 3OZ (MISCELLANEOUS) IMPLANT
TAPE FIBER 2MM 7IN #2 BLUE (SUTURE) IMPLANT
TOWEL OR 17X24 6PK STRL BLUE (TOWEL DISPOSABLE) ×2 IMPLANT
TOWEL OR NON WOVEN STRL DISP B (DISPOSABLE) ×2 IMPLANT
TUBE CONNECTING 20X1/4 (TUBING) ×2 IMPLANT
TUBING ARTHROSCOPY IRRIG 16FT (MISCELLANEOUS) ×2 IMPLANT
WAND STAR VAC 90 (SURGICAL WAND) ×2 IMPLANT
WATER STERILE IRR 1000ML POUR (IV SOLUTION) ×2 IMPLANT
YANKAUER SUCT BULB TIP NO VENT (SUCTIONS) ×2 IMPLANT

## 2015-01-30 NOTE — Anesthesia Preprocedure Evaluation (Signed)
Anesthesia Evaluation  Patient identified by MRN, date of birth, ID band Patient awake    Reviewed: Allergy & Precautions, NPO status , Patient's Chart, lab work & pertinent test results  History of Anesthesia Complications (+) PONV  Airway Mallampati: I  TM Distance: >3 FB Neck ROM: Full    Dental  (+) Teeth Intact, Dental Advisory Given   Pulmonary Current Smoker,    breath sounds clear to auscultation       Cardiovascular  Rhythm:Regular Rate:Normal     Neuro/Psych    GI/Hepatic GERD  Controlled and Medicated,  Endo/Other    Renal/GU      Musculoskeletal   Abdominal   Peds  Hematology   Anesthesia Other Findings   Reproductive/Obstetrics                             Anesthesia Physical Anesthesia Plan  ASA: II  Anesthesia Plan: General and Regional   Post-op Pain Management:    Induction: Intravenous  Airway Management Planned: Oral ETT  Additional Equipment:   Intra-op Plan:   Post-operative Plan: Extubation in OR  Informed Consent: I have reviewed the patients History and Physical, chart, labs and discussed the procedure including the risks, benefits and alternatives for the proposed anesthesia with the patient or authorized representative who has indicated his/her understanding and acceptance.   Dental advisory given  Plan Discussed with: CRNA, Anesthesiologist and Surgeon  Anesthesia Plan Comments:         Anesthesia Quick Evaluation

## 2015-01-30 NOTE — Discharge Instructions (Signed)
Discharge Instructions after Arthroscopic Shoulder Surgery   A sling has been provided for you. You may remove the sling after 72 hours. The sling may be worn for your protection, if you are in a crowd.  Use ice on the shoulder intermittently over the first 48 hours after surgery.  Pain medication has been prescribed for you.  Use your medication liberally over the first 48 hours, and then begin to taper your use. You may take Extra Strength Tylenol or Tylenol only in place of the pain pills. DO NOT take ANY nonsteroidal anti-inflammatory pain medications: Advil, Motrin, Ibuprofen, Aleve, Naproxen, or Naprosyn.  You may remove your dressing after two days.  You may shower 5 days after surgery. The incision CANNOT get wet prior to 5 days. Simply allow the water to wash over the site and then pat dry. Do not rub the incision. Make sure your axilla (armpit) is completely dry after showering.  Take one aspirin a day for 2 weeks after surgery, unless you have an aspirin sensitivity/allergy or asthma.  Three to 5 times each day you should perform assisted overhead reaching and external rotation (outward turning) exercises with the operative arm. Both exercises should be done with the non-operative arm used as the "therapist arm" while the operative arm remains relaxed. Ten of each exercise should be done three to five times each day. Do no flex the arm at the elbow (like a bicep curl) with any weight in the hand.    Overhead reach is helping to lift your stiff arm up as high as it will go. To stretch your overhead reach, lie flat on your back, relax, and grasp the wrist of the tight shoulder with your opposite hand. Using the power in your opposite arm, bring the stiff arm up as far as it is comfortable. Start holding it for ten seconds and then work up to where you can hold it for a count of 30. Breathe slowly and deeply while the arm is moved. Repeat this stretch ten times, trying to help the arm up a  little higher each time.       External rotation is turning the arm out to the side while your elbow stays close to your body. External rotation is best stretched while you are lying on your back. Hold a cane, yardstick, broom handle, or dowel in both hands. Bend both elbows to a right angle. Use steady, gentle force from your normal arm to rotate the hand of the stiff shoulder out away from your body. Continue the rotation as far as it will go comfortably, holding it there for a count of 10. Repeat this exercise ten times.     Please call [phone] during normal business hours or [phone] after hours for any problems. Including the following:  - excessive redness of the incisions - drainage for more than 4 days - fever of more than 101.5 F  *Please note that pain medications will not be refilled after hours or on weekends.    Post Anesthesia Home Care Instructions  Activity: Get plenty of rest for the remainder of the day. A responsible adult should stay with you for 24 hours following the procedure.  For the next 24 hours, DO NOT: -Drive a car -Paediatric nurse -Drink alcoholic beverages -Take any medication unless instructed by your physician -Make any legal decisions or sign important papers.  Meals: Start with liquid foods such as gelatin or soup. Progress to regular foods as tolerated. Avoid greasy, spicy,  heavy foods. If nausea and/or vomiting occur, drink only clear liquids until the nausea and/or vomiting subsides. Call your physician if vomiting continues.  Special Instructions/Symptoms: Your throat may feel dry or sore from the anesthesia or the breathing tube placed in your throat during surgery. If this causes discomfort, gargle with warm salt water. The discomfort should disappear within 24 hours.  If you had a scopolamine patch placed behind your ear for the management of post- operative nausea and/or vomiting:  1. The medication in the patch is effective  for 72 hours, after which it should be removed.  Wrap patch in a tissue and discard in the trash. Wash hands thoroughly with soap and water. 2. You may remove the patch earlier than 72 hours if you experience unpleasant side effects which may include dry mouth, dizziness or visual disturbances. 3. Avoid touching the patch. Wash your hands with soap and water after contact with the patch.     Regional Anesthesia Blocks  1. Numbness or the inability to move the "blocked" extremity may last from 3-48 hours after placement. The length of time depends on the medication injected and your individual response to the medication. If the numbness is not going away after 48 hours, call your surgeon.  2. The extremity that is blocked will need to be protected until the numbness is gone and the  Strength has returned. Because you cannot feel it, you will need to take extra care to avoid injury. Because it may be weak, you may have difficulty moving it or using it. You may not know what position it is in without looking at it while the block is in effect.  3. For blocks in the legs and feet, returning to weight bearing and walking needs to be done carefully. You will need to wait until the numbness is entirely gone and the strength has returned. You should be able to move your leg and foot normally before you try and bear weight or walk. You will need someone to be with you when you first try to ensure you do not fall and possibly risk injury.  4. Bruising and tenderness at the needle site are common side effects and will resolve in a few days.  5. Persistent numbness or new problems with movement should be communicated to the surgeon or the [place] [phone] [place] [phone]).

## 2015-01-30 NOTE — Transfer of Care (Signed)
Immediate Anesthesia Transfer of Care Note  Patient: Rebecca Pearson  Procedure(s) Performed: Procedure(s) with comments: SHOULDER ARTHROSCOPY WITH BICEPS TENOTOMY, OPEN TENODESIS,LABRAL DEBRIDEMENT (Right) - Rigth shoulder diagnostic arthroscopy biceps tenotomy, open tenodesis, labral debridement  Patient Location: PACU  Anesthesia Type:General  Level of Consciousness: awake and sedated  Airway & Oxygen Therapy: Patient Spontanous Breathing and Patient connected to face mask oxygen  Post-op Assessment: Report given to RN and Post -op Vital signs reviewed and stable  Post vital signs: Reviewed and stable  Last Vitals:  Filed Vitals:   01/30/15 1320  BP:   Pulse: 80  Temp:   Resp: 18    Complications: No apparent anesthesia complications

## 2015-01-30 NOTE — H&P (Signed)
Rebecca Pearson is an 34 y.o. female.   Chief Complaint: R shoulder pain HPI: R shoulder pain/dysfunction c/w labral tear, failed conservative treatment.  Past Medical History  Diagnosis Date  . Palpitations     tachypalpitations; states no episodes since 03/2014; on beta blocker for migraines, states may have helped palpitations also  . Gastroesophageal reflux disease   . PONV (postoperative nausea and vomiting)   . Migraines   . History of thrombocytopenia     during pregnancy x 2  . Shoulder pain, right 01/2015    possible SLAP tear    Past Surgical History  Procedure Laterality Date  . Tonsillectomy  2002    Family History  Problem Relation Age of Onset  . Migraines Mother   . Hypothyroidism Mother   . Cancer Father     lung mets to brain, liver lympnode   . GER disease Father   . Irritable bowel syndrome Father   . GER disease Brother    Social History:  reports that she has been smoking Cigarettes.  She has smoked for the past 10 years. She has never used smokeless tobacco. She reports that she drinks alcohol. She reports that she does not use illicit drugs.  Allergies: No Active Allergies  Medications Prior to Admission  Medication Sig Dispense Refill  . ibuprofen (ADVIL,MOTRIN) 200 MG tablet Take 200 mg by mouth every 6 (six) hours as needed.    . norgestimate-ethinyl estradiol (ORTHO-CYCLEN,SPRINTEC,PREVIFEM) 0.25-35 MG-MCG tablet Take 1 tablet by mouth daily.    . pantoprazole (PROTONIX) 40 MG tablet Take 40 mg by mouth daily.    . propranolol (INNOPRAN XL) 120 MG 24 hr capsule Take 120 mg by mouth daily.    . ranitidine (ZANTAC) 300 MG tablet Take 300 mg by mouth at bedtime.      No results found for this or any previous visit (from the past 48 hour(s)). No results found.  Review of Systems  All other systems reviewed and are negative.   Blood pressure 115/68, pulse 55, temperature 98.6 F (37 C), temperature source Oral, resp. rate 14, height 5\' 7"   (1.702 m), weight 66.679 kg (147 lb), last menstrual period 01/03/2015, SpO2 100 %. Physical Exam  Constitutional: She is oriented to person, place, and time. She appears well-developed and well-nourished.  HENT:  Head: Atraumatic.  Eyes: EOM are normal.  Cardiovascular: Intact distal pulses.   Respiratory: Effort normal.  Musculoskeletal:  R shoulder +obriens, NVID  Neurological: She is alert and oriented to person, place, and time.  Skin: Skin is warm and dry.  Psychiatric: She has a normal mood and affect.     Assessment/Plan R shoulder c/o labral tear Plan diagnostic arthroscopy, debridement vs repair, vs tenotomy and tenodesis Risks / benefits of surgery discussed Consent on chart  NPO for OR Preop antibiotics   Rebecca Pearson WILLIAM 01/30/2015, 11:42 AM

## 2015-01-30 NOTE — Anesthesia Procedure Notes (Addendum)
Anesthesia Regional Block:  Interscalene brachial plexus block  Pre-Anesthetic Checklist: ,, timeout performed, Correct Patient, Correct Site, Correct Laterality, Correct Procedure, Correct Position, site marked, Risks and benefits discussed,  Surgical consent,  Pre-op evaluation,  At surgeon's request and post-op pain management  Laterality: Upper and Right  Prep: chloraprep       Needles:  Injection technique: Single-shot  Needle Type: Echogenic Needle     Needle Length: 5cm 5 cm Needle Gauge: 21 and 21 G    Additional Needles:  Procedures: ultrasound guided (picture in chart) Interscalene brachial plexus block Narrative:  Start time: 01/30/2015 10:49 AM End time: 01/30/2015 10:53 AM Injection made incrementally with aspirations every 5 mL.  Performed by: Personally  Anesthesiologist: Ellajane Stong   Procedure Name: Intubation Performed by: Terrance Mass Pre-anesthesia Checklist: Patient identified, Timeout performed, Emergency Drugs available, Suction available and Patient being monitored Patient Re-evaluated:Patient Re-evaluated prior to inductionOxygen Delivery Method: Circle system utilized Preoxygenation: Pre-oxygenation with 100% oxygen Intubation Type: IV induction Ventilation: Mask ventilation without difficulty Laryngoscope Size: Miller and 2 Grade View: Grade I Tube type: Oral Tube size: 7.0 mm Number of attempts: 1 Airway Equipment and Method: Stylet Placement Confirmation: ETT inserted through vocal cords under direct vision,  positive ETCO2 and breath sounds checked- equal and bilateral Secured at: 22 cm Tube secured with: Tape Dental Injury: Teeth and Oropharynx as per pre-operative assessment

## 2015-01-30 NOTE — Op Note (Signed)
Procedure(s): SHOULDER ARTHROSCOPY   Rebecca Pearson female 34 y.o. 01/30/2015  Procedure(s) and Anesthesia Type:   #1 right shoulder diagnostic arthroscopy with debridement inferior posterior and superior labral tear with biceps tenotomy  #2 right shoulder arthroscopic subacromial decompression/bursectomy #3 right shoulder open subpectoral biceps tenodesis  Surgeon(s) and Role:    * Tania Ade, MD - Primary     Surgeon: Nita Sells   Assistants: Jeanmarie Hubert PA-C (Danielle was present and scrubbed throughout the procedure and was essential in positioning, assisting with the camera and instrumentation,, and closure)  Anesthesia: General endotracheal anesthesia with preoperative interscalene block given by the attending anesthesiologist    Procedure Detail   Estimated Blood Loss: Min         Drains: none  Blood Given: none         Specimens: none        Complications:  * No complications entered in OR log *         Disposition: PACU - hemodynamically stable.         Condition: stable    Procedure:   INDICATIONS FOR SURGERY: The patient is 35 y.o. female who has had a long history of right shoulder pain with subluxation of and high school. She has played a lot of volleyball and overhead athletics over the years. She also participates in cross fit and has had worsening right shoulder pain which is failed extensive conservative management including 2 rounds of physical therapy and an intra-articular corticosteroid-induced injection. She did have temporary relief with the injection. She had an MRI arthrogram which was inconclusive. Based on signs and symptoms concerning for superior labral tear she was also taken to the operating room for diagnostic arthroscopy and treatment as indicated. She understood risks benefits alternatives to the procedure was to go forward with surgery.  OPERATIVE FINDINGS: Examination under anesthesia: No stiffness. She was  noted to have some diffuse laxity which was 2+ anterior, posterior and sulcus.   DESCRIPTION OF PROCEDURE: The patient was identified in preoperative  holding area where I personally marked the operative site after  verifying site, side, and procedure with the patient. An interscalene block was given by the attending anesthesiologist the holding area.  The patient was taken back to the operating room where general anesthesia was induced without complication and was placed in the beach-chair position with the back  elevated about 60 degrees and all extremities and head and neck carefully padded and  positioned.   The right upper extremity was then prepped and  draped in a standard sterile fashion. The appropriate time-out  procedure was carried out. The patient did receive IV antibiotics  within 30 minutes of incision.   A small posterior portal incision was made and the arthroscope was introduced into the joint. An anterior portal was then established above the subscapularis using needle localization. Small cannula was placed anteriorly. Diagnostic arthroscopy was then carried out.  Subscapularis and supraspinatus are noted to be intact on mild fraying. This was gently debrided. Glenohumeral joint surfaces were intact. She was noted to have some tearing of the labrum in multiple areas. The anterior-inferior labrum was intact, however superiorly at the anterior origin of the biceps tendon extending from the 12:00 to 1:00 position she did have a small complete tear with detachment of the biceps origin which was minimally displaceable. The biceps tendon was pulled into the joint and found to have some diffuse tenosynovitis in the intra-groove portion. The posterior labrum was also noted  to be partially torn as well as inferiorly at the 6 clock position. This was debrided from the anterior portal and the camera was moved anterior position to better assess the posterior labrum. There was some flap  tearing of the posterior labrum but was not completely detached. The shaver was used through the posterior portal to debride this extensively. Given the preoperative exam and the small type II SLAP tear noted I felt that tenotomy and open subpectoral tenodesis be the best chance of alleviating her pain. Therefore a large biter was used through the anterior portal to release the biceps off of the superior labrum and allowed to retract out of the shoulder.  The arthroscope was then introduced into the subacromial space a standard lateral portal was established with needle localization. The shaver was used through the lateral portal to perform extensive bursectomy. Coracoacromial ligament was examined and found to be thickened and frayed indicating impingement.  The bursal surface of the rotator cuff was carefully examined and found to be completely intact.  The coracoacromial ligament was taken down off the anterior acromion with the ArthroCare exposing a small curved anterior acromial spur. A high-speed bur was then used through the lateral portal to take down the anterior acromial spur from lateral to medial in a standard acromioplasty.  The acromioplasty was also viewed from the lateral portal and the bur was used as necessary to ensure that the acromion was completely flat from posterior to anterior.   Attention was then turned to the axilla where a approximately 3 cm incision was made in the dominant axillary fold. This was about 50% above and 50% below the palpable lower border of the pectoralis major. Dissection was carried out between the lower border of the pectoralis major and the short head of the biceps muscle belly. The anterior humerus was then exposed and the long head biceps was delivered out through the wound. The biceps was prepared using a #2 FiberWire fiber loop and the remaining portion of the biceps tendon was discarded after choosing the appropriate tension and length. A drill bit  slightly smaller than the tendon was used in the distal bicipital groove to create an intramedullary hole and then a drill bit about 12 mm distal to that was used which was slightly larger than the suture passer needle. A crescent suture lasso was then used to pass the sutures from proximal to distal and then one suture was brought around medial and lateral to the tendon. It was tensioned, dunking the tendon into the intramedullary canal and tied over the anterior portion of the tendon. The tension was felt to be appropriate. The wound was copiously irrigated with normal saline and subsequently closed in layers with 2-0 Vicryl in the deep dermal layer and Dermabond for skin closure.  The arthroscopic equipment was removed from the joint and the portals were closed with 3-0 nylon in an interrupted fashion. Sterile dressings were then applied including Xeroform 4 x 4's ABDs and tape. The patient was then allowed to awaken from general anesthesia, placed in a sling, transferred to the stretcher and taken to the recovery room in stable condition.   POSTOPERATIVE PLAN: The patient will be discharged home today and will followup in one week for suture removal and wound check.  She will begin some early shoulder range of motion but no active elbow flexion against resistance for 6 weeks.

## 2015-01-30 NOTE — Progress Notes (Signed)
Assisted Dr. Crews with right, ultrasound guided, infraclavicular block. Side rails up, monitors on throughout procedure. See vital signs in flow sheet. Tolerated Procedure well. 

## 2015-01-30 NOTE — Anesthesia Postprocedure Evaluation (Signed)
  Anesthesia Post-op Note  Patient: Rebecca Pearson  Procedure(s) Performed: Procedure(s) (LRB): SHOULDER ARTHROSCOPY WITH BICEPS TENOTOMY, OPEN TENODESIS,LABRAL DEBRIDEMENT (Right)  Patient Location: PACU  Anesthesia Type: GA combined with regional for post-op pain  Level of Consciousness: awake and alert   Airway and Oxygen Therapy: Patient Spontanous Breathing  Post-op Pain: mild  Post-op Assessment: Post-op Vital signs reviewed, Patient's Cardiovascular Status Stable, Respiratory Function Stable, Patent Airway and No signs of Nausea or vomiting  Last Vitals:  Filed Vitals:   01/30/15 1415  BP: 102/62  Pulse: 54  Temp:   Resp: 13    Post-op Vital Signs: stable   Complications: No apparent anesthesia complications

## 2015-01-31 ENCOUNTER — Encounter (HOSPITAL_BASED_OUTPATIENT_CLINIC_OR_DEPARTMENT_OTHER): Payer: Self-pay | Admitting: Orthopedic Surgery

## 2015-01-31 LAB — POCT HEMOGLOBIN-HEMACUE: Hemoglobin: 12.6 g/dL (ref 12.0–15.0)

## 2015-01-31 NOTE — Addendum Note (Signed)
Addendum  created 01/31/15 0919 by Ernesta Amble Marcelline Temkin, CRNA   Modules edited: Charges VN

## 2015-02-06 NOTE — Addendum Note (Signed)
Addendum  created 02/06/15 1144 by Lorrene Reid, MD   Modules edited: Anesthesia Blocks and Procedures, Clinical Notes   Clinical Notes:  File: [id number]

## 2016-12-03 ENCOUNTER — Other Ambulatory Visit: Payer: Self-pay | Admitting: Gastroenterology

## 2016-12-03 DIAGNOSIS — R1010 Upper abdominal pain, unspecified: Secondary | ICD-10-CM

## 2016-12-04 ENCOUNTER — Ambulatory Visit
Admission: RE | Admit: 2016-12-04 | Discharge: 2016-12-04 | Disposition: A | Discharge status: Home / Self Care | Payer: 59 | Source: Ambulatory Visit | Attending: Gastroenterology | Admitting: Gastroenterology

## 2016-12-04 DIAGNOSIS — R1010 Upper abdominal pain, unspecified: Secondary | ICD-10-CM

## 2016-12-30 ENCOUNTER — Other Ambulatory Visit (HOSPITAL_COMMUNITY): Payer: Self-pay | Admitting: Physician Assistant

## 2016-12-30 DIAGNOSIS — R1011 Right upper quadrant pain: Secondary | ICD-10-CM

## 2017-01-03 ENCOUNTER — Encounter (HOSPITAL_COMMUNITY)
Admission: RE | Admit: 2017-01-03 | Discharge: 2017-01-03 | Disposition: A | Discharge status: Home / Self Care | Payer: 59 | Source: Ambulatory Visit | Attending: Physician Assistant | Admitting: Physician Assistant

## 2017-01-03 DIAGNOSIS — R1011 Right upper quadrant pain: Secondary | ICD-10-CM | POA: Insufficient documentation

## 2017-01-03 MED ORDER — TECHNETIUM TC 99M MEBROFENIN IV KIT
4.9000 | PACK | Freq: Once | INTRAVENOUS | Status: AC | PRN
  Administered 2017-01-03: 4.9 via INTRAVENOUS

## 2017-01-17 ENCOUNTER — Other Ambulatory Visit: Payer: Self-pay | Admitting: General Surgery

## 2017-01-18 DIAGNOSIS — K8050 Calculus of bile duct without cholangitis or cholecystitis without obstruction: Secondary | ICD-10-CM

## 2017-01-18 HISTORY — DX: Calculus of bile duct without cholangitis or cholecystitis without obstruction: K80.50

## 2017-02-03 ENCOUNTER — Encounter (HOSPITAL_COMMUNITY)
Admission: RE | Admit: 2017-02-03 | Discharge: 2017-02-03 | Disposition: A | Discharge status: Home / Self Care | Payer: 59 | Source: Ambulatory Visit | Attending: General Surgery | Admitting: General Surgery

## 2017-02-03 ENCOUNTER — Encounter (HOSPITAL_BASED_OUTPATIENT_CLINIC_OR_DEPARTMENT_OTHER): Payer: Self-pay | Admitting: *Deleted

## 2017-02-03 DIAGNOSIS — K529 Noninfective gastroenteritis and colitis, unspecified: Secondary | ICD-10-CM | POA: Insufficient documentation

## 2017-02-03 DIAGNOSIS — K8050 Calculus of bile duct without cholangitis or cholecystitis without obstruction: Secondary | ICD-10-CM | POA: Insufficient documentation

## 2017-02-03 DIAGNOSIS — Z01812 Encounter for preprocedural laboratory examination: Secondary | ICD-10-CM | POA: Diagnosis present

## 2017-02-03 DIAGNOSIS — K219 Gastro-esophageal reflux disease without esophagitis: Secondary | ICD-10-CM | POA: Insufficient documentation

## 2017-02-03 LAB — CBC WITH DIFFERENTIAL/PLATELET
Basophils Absolute: 0 K/uL (ref 0.0–0.1)
Basophils Relative: 0 %
Eosinophils Absolute: 0.2 K/uL (ref 0.0–0.7)
Eosinophils Relative: 2 %
HCT: 42.5 % (ref 36.0–46.0)
Hemoglobin: 14.4 g/dL (ref 12.0–15.0)
Lymphocytes Relative: 41 %
Lymphs Abs: 4 K/uL (ref 0.7–4.0)
MCH: 32.3 pg (ref 26.0–34.0)
MCHC: 33.9 g/dL (ref 30.0–36.0)
MCV: 95.3 fL (ref 78.0–100.0)
Monocytes Absolute: 0.8 K/uL (ref 0.1–1.0)
Monocytes Relative: 8 %
Neutro Abs: 4.7 K/uL (ref 1.7–7.7)
Neutrophils Relative %: 49 %
Platelets: 250 K/uL (ref 150–400)
RBC: 4.46 MIL/uL (ref 3.87–5.11)
RDW: 13.4 % (ref 11.5–15.5)
WBC: 9.6 K/uL (ref 4.0–10.5)

## 2017-02-03 LAB — COMPREHENSIVE METABOLIC PANEL
ALT: 13 U/L — ABNORMAL LOW (ref 14–54)
AST: 15 U/L (ref 15–41)
Albumin: 4.4 g/dL (ref 3.5–5.0)
Alkaline Phosphatase: 53 U/L (ref 38–126)
Anion gap: 9 (ref 5–15)
BUN: 12 mg/dL (ref 6–20)
CO2: 26 mmol/L (ref 22–32)
Calcium: 9.3 mg/dL (ref 8.9–10.3)
Chloride: 102 mmol/L (ref 101–111)
Creatinine, Ser: 0.84 mg/dL (ref 0.44–1.00)
GFR calc Af Amer: >60 mL/min (ref >60)
GFR calc non Af Amer: >60 mL/min (ref >60)
Glucose, Bld: 99 mg/dL (ref 65–99)
Potassium: 3.8 mmol/L (ref 3.5–5.1)
Sodium: 137 mmol/L (ref 135–145)
Total Bilirubin: 0.4 mg/dL (ref 0.3–1.2)
Total Protein: 7.2 g/dL (ref 6.5–8.1)

## 2017-02-03 NOTE — Pre-Procedure Instructions (Addendum)
To have CBC, diff, CMET done at [facility]; family member will pick up Ensure pre-surgery drink 10 oz. - pt. to drink by 0715 DOS.

## 2017-02-04 NOTE — Pre-Procedure Instructions (Signed)
Ensure pre-surgery drink given to pt's sister-in-law, April Carter - pt. to drink by 0715 DOS.

## 2017-02-06 DIAGNOSIS — K589 Irritable bowel syndrome without diarrhea: Secondary | ICD-10-CM | POA: Insufficient documentation

## 2017-02-10 ENCOUNTER — Ambulatory Visit (HOSPITAL_BASED_OUTPATIENT_CLINIC_OR_DEPARTMENT_OTHER): Payer: 59 | Admitting: Anesthesiology

## 2017-02-10 ENCOUNTER — Encounter (HOSPITAL_BASED_OUTPATIENT_CLINIC_OR_DEPARTMENT_OTHER)
Admission: RE | Disposition: A | Discharge status: Home / Self Care | Payer: Self-pay | Source: Ambulatory Visit | Attending: General Surgery

## 2017-02-10 ENCOUNTER — Encounter (HOSPITAL_BASED_OUTPATIENT_CLINIC_OR_DEPARTMENT_OTHER): Payer: Self-pay | Admitting: Anesthesiology

## 2017-02-10 ENCOUNTER — Ambulatory Visit (HOSPITAL_BASED_OUTPATIENT_CLINIC_OR_DEPARTMENT_OTHER)
Admission: RE | Admit: 2017-02-10 | Discharge: 2017-02-10 | Disposition: A | Discharge status: Home / Self Care | Payer: 59 | Source: Ambulatory Visit | Attending: General Surgery | Admitting: General Surgery

## 2017-02-10 DIAGNOSIS — K219 Gastro-esophageal reflux disease without esophagitis: Secondary | ICD-10-CM | POA: Diagnosis not present

## 2017-02-10 DIAGNOSIS — Z79899 Other long term (current) drug therapy: Secondary | ICD-10-CM | POA: Insufficient documentation

## 2017-02-10 DIAGNOSIS — K8044 Calculus of bile duct with chronic cholecystitis without obstruction: Secondary | ICD-10-CM | POA: Diagnosis present

## 2017-02-10 DIAGNOSIS — F17200 Nicotine dependence, unspecified, uncomplicated: Secondary | ICD-10-CM | POA: Diagnosis not present

## 2017-02-10 HISTORY — DX: Personal history of other diseases of the nervous system and sense organs: Z86.69

## 2017-02-10 HISTORY — DX: Calculus of bile duct without cholangitis or cholecystitis without obstruction: K80.50

## 2017-02-10 HISTORY — DX: Personal history of other specified conditions: Z87.898

## 2017-02-10 HISTORY — DX: Personal history of other diseases of the digestive system: Z87.19

## 2017-02-10 HISTORY — PX: CHOLECYSTECTOMY: SHX55

## 2017-02-10 SURGERY — LAPAROSCOPIC CHOLECYSTECTOMY
Anesthesia: General | Site: Abdomen

## 2017-02-10 MED ORDER — ACETAMINOPHEN 500 MG PO TABS
ORAL_TABLET | ORAL | Status: AC
  Filled 2017-02-10: qty 2

## 2017-02-10 MED ORDER — CELECOXIB 200 MG PO CAPS
200.0000 mg | ORAL_CAPSULE | ORAL | Status: AC
  Administered 2017-02-10: 200 mg via ORAL

## 2017-02-10 MED ORDER — PROPOFOL 10 MG/ML IV BOLUS
INTRAVENOUS | Status: AC
  Filled 2017-02-10: qty 20

## 2017-02-10 MED ORDER — HYDROMORPHONE HCL 1 MG/ML IJ SOLN
INTRAMUSCULAR | Status: AC
  Filled 2017-02-10: qty 0.5

## 2017-02-10 MED ORDER — HYDROMORPHONE HCL 1 MG/ML IJ SOLN
0.2500 mg | INTRAMUSCULAR | Status: DC | PRN
  Administered 2017-02-10: 0.25 mg via INTRAVENOUS
  Administered 2017-02-10: 0.5 mg via INTRAVENOUS
  Administered 2017-02-10 (×3): 0.25 mg via INTRAVENOUS
  Administered 2017-02-10: 0.5 mg via INTRAVENOUS

## 2017-02-10 MED ORDER — FENTANYL CITRATE (PF) 100 MCG/2ML IJ SOLN
INTRAMUSCULAR | Status: AC
  Filled 2017-02-10: qty 2

## 2017-02-10 MED ORDER — DEXAMETHASONE SODIUM PHOSPHATE 4 MG/ML IJ SOLN
INTRAMUSCULAR | Status: DC | PRN
  Administered 2017-02-10: 10 mg via INTRAVENOUS

## 2017-02-10 MED ORDER — DEXAMETHASONE SODIUM PHOSPHATE 10 MG/ML IJ SOLN
INTRAMUSCULAR | Status: AC
  Filled 2017-02-10: qty 1

## 2017-02-10 MED ORDER — PHENYLEPHRINE HCL 10 MG/ML IJ SOLN
INTRAMUSCULAR | Status: DC | PRN
  Administered 2017-02-10: 80 ug via INTRAVENOUS

## 2017-02-10 MED ORDER — KETOROLAC TROMETHAMINE 30 MG/ML IJ SOLN: 30.0000 mg | Freq: Once | INTRAMUSCULAR | Status: DC | PRN

## 2017-02-10 MED ORDER — SCOPOLAMINE 1 MG/3DAYS TD PT72
MEDICATED_PATCH | TRANSDERMAL | Status: AC
  Filled 2017-02-10: qty 1

## 2017-02-10 MED ORDER — OXYCODONE HCL 5 MG PO TABS
ORAL_TABLET | ORAL | Status: AC
  Filled 2017-02-10: qty 1

## 2017-02-10 MED ORDER — OXYCODONE-ACETAMINOPHEN 10-325 MG PO TABS: 1.0000 | ORAL_TABLET | Freq: Four times a day (QID) | ORAL | 0 refills | Status: AC | PRN

## 2017-02-10 MED ORDER — CEFAZOLIN SODIUM-DEXTROSE 2-4 GM/100ML-% IV SOLN
INTRAVENOUS | Status: AC
  Filled 2017-02-10: qty 100

## 2017-02-10 MED ORDER — HYDROMORPHONE HCL 1 MG/ML IJ SOLN
INTRAMUSCULAR | Status: AC
Start: 2017-02-10 — End: 2017-02-10
  Filled 2017-02-10: qty 0.5

## 2017-02-10 MED ORDER — BUPIVACAINE-EPINEPHRINE (PF) 0.25% -1:200000 IJ SOLN
INTRAMUSCULAR | Status: AC
  Filled 2017-02-10: qty 30

## 2017-02-10 MED ORDER — GABAPENTIN 300 MG PO CAPS
ORAL_CAPSULE | ORAL | Status: AC
  Filled 2017-02-10: qty 1

## 2017-02-10 MED ORDER — PROMETHAZINE HCL 25 MG/ML IJ SOLN: 6.2500 mg | INTRAMUSCULAR | Status: DC | PRN

## 2017-02-10 MED ORDER — ROCURONIUM BROMIDE 100 MG/10ML IV SOLN
INTRAVENOUS | Status: DC | PRN
  Administered 2017-02-10: 40 mg via INTRAVENOUS

## 2017-02-10 MED ORDER — LIDOCAINE 2% (20 MG/ML) 5 ML SYRINGE
INTRAMUSCULAR | Status: DC | PRN
  Administered 2017-02-10: 60 mg via INTRAVENOUS

## 2017-02-10 MED ORDER — CELECOXIB 200 MG PO CAPS
ORAL_CAPSULE | ORAL | Status: AC
  Filled 2017-02-10: qty 1

## 2017-02-10 MED ORDER — ACETAMINOPHEN 500 MG PO TABS
1000.0000 mg | ORAL_TABLET | ORAL | Status: AC
  Administered 2017-02-10: 1000 mg via ORAL

## 2017-02-10 MED ORDER — PROPOFOL 10 MG/ML IV BOLUS
INTRAVENOUS | Status: DC | PRN
  Administered 2017-02-10: 160 mg via INTRAVENOUS

## 2017-02-10 MED ORDER — BUPIVACAINE-EPINEPHRINE 0.25% -1:200000 IJ SOLN
INTRAMUSCULAR | Status: DC | PRN
  Administered 2017-02-10: 11 mL

## 2017-02-10 MED ORDER — BUPIVACAINE HCL (PF) 0.25 % IJ SOLN
INTRAMUSCULAR | Status: AC
  Filled 2017-02-10: qty 60

## 2017-02-10 MED ORDER — GABAPENTIN 300 MG PO CAPS
300.0000 mg | ORAL_CAPSULE | ORAL | Status: AC
  Administered 2017-02-10: 300 mg via ORAL

## 2017-02-10 MED ORDER — ROCURONIUM BROMIDE 10 MG/ML (PF) SYRINGE
PREFILLED_SYRINGE | INTRAVENOUS | Status: AC
  Filled 2017-02-10: qty 5

## 2017-02-10 MED ORDER — LACTATED RINGERS IV SOLN
INTRAVENOUS | Status: DC
  Administered 2017-02-10 (×2): via INTRAVENOUS

## 2017-02-10 MED ORDER — CHLORHEXIDINE GLUCONATE CLOTH 2 % EX PADS: 6.0000 | MEDICATED_PAD | Freq: Once | CUTANEOUS | Status: DC

## 2017-02-10 MED ORDER — ONDANSETRON HCL 4 MG/2ML IJ SOLN
INTRAMUSCULAR | Status: AC
  Filled 2017-02-10: qty 2

## 2017-02-10 MED ORDER — ONDANSETRON HCL 4 MG/2ML IJ SOLN
INTRAMUSCULAR | Status: DC | PRN
  Administered 2017-02-10: 4 mg via INTRAVENOUS

## 2017-02-10 MED ORDER — CEFAZOLIN SODIUM-DEXTROSE 2-4 GM/100ML-% IV SOLN
2.0000 g | INTRAVENOUS | Status: AC
  Administered 2017-02-10: 2 g via INTRAVENOUS

## 2017-02-10 MED ORDER — HEMOSTATIC AGENTS (NO CHARGE) OPTIME
TOPICAL | Status: DC | PRN
  Administered 2017-02-10: 1 via TOPICAL

## 2017-02-10 MED ORDER — OXYCODONE HCL 5 MG PO TABS
5.0000 mg | ORAL_TABLET | Freq: Once | ORAL | Status: AC | PRN
  Administered 2017-02-10: 5 mg via ORAL

## 2017-02-10 MED ORDER — MIDAZOLAM HCL 2 MG/2ML IJ SOLN
1.0000 mg | INTRAMUSCULAR | Status: DC | PRN
  Administered 2017-02-10: 2 mg via INTRAVENOUS

## 2017-02-10 MED ORDER — SUGAMMADEX SODIUM 200 MG/2ML IV SOLN
INTRAVENOUS | Status: DC | PRN
  Administered 2017-02-10: 200 mg via INTRAVENOUS

## 2017-02-10 MED ORDER — FENTANYL CITRATE (PF) 100 MCG/2ML IJ SOLN
50.0000 ug | INTRAMUSCULAR | Status: AC | PRN
  Administered 2017-02-10: 50 ug via INTRAVENOUS
  Administered 2017-02-10: 100 ug via INTRAVENOUS
  Administered 2017-02-10: 50 ug via INTRAVENOUS

## 2017-02-10 MED ORDER — MIDAZOLAM HCL 2 MG/2ML IJ SOLN
INTRAMUSCULAR | Status: AC
  Filled 2017-02-10: qty 2

## 2017-02-10 MED ORDER — SODIUM CHLORIDE 0.9 % IR SOLN
Status: DC | PRN
  Administered 2017-02-10: 1000 mL

## 2017-02-10 MED ORDER — PROMETHAZINE HCL 12.5 MG PO TABS: 12.5000 mg | ORAL_TABLET | Freq: Four times a day (QID) | ORAL | 0 refills | Status: DC | PRN

## 2017-02-10 MED ORDER — SCOPOLAMINE 1 MG/3DAYS TD PT72
1.0000 | MEDICATED_PATCH | Freq: Once | TRANSDERMAL | Status: DC | PRN
  Administered 2017-02-10: 1.5 mg via TRANSDERMAL

## 2017-02-10 SURGICAL SUPPLY — 46 items
APPLIER CLIP 5 13 M/L LIGAMAX5 (MISCELLANEOUS) ×2
APR CLP MED LRG 5 ANG JAW (MISCELLANEOUS) ×1
BAG SPEC RTRVL 10 TROC 200 (ENDOMECHANICALS) ×1
BLADE CLIPPER SURG (BLADE) IMPLANT
CANISTER SUCT 1200ML W/VALVE (MISCELLANEOUS) ×2 IMPLANT
CHLORAPREP W/TINT 26ML (MISCELLANEOUS) ×2 IMPLANT
CLIP APPLIE 5 13 M/L LIGAMAX5 (MISCELLANEOUS) ×1 IMPLANT
COVER MAYO STAND STRL (DRAPES) IMPLANT
DECANTER SPIKE VIAL GLASS SM (MISCELLANEOUS) IMPLANT
DERMABOND ADVANCED (GAUZE/BANDAGES/DRESSINGS) ×1
DERMABOND ADVANCED .7 DNX12 (GAUZE/BANDAGES/DRESSINGS) ×1 IMPLANT
DEVICE TROCAR PUNCTURE CLOSURE (ENDOMECHANICALS) ×2 IMPLANT
DRAPE C-ARM 42X72 X-RAY (DRAPES) IMPLANT
DRAPE LAPAROSCOPIC ABDOMINAL (DRAPES) ×2 IMPLANT
ELECT REM PT RETURN 9FT ADLT (ELECTROSURGICAL) ×2
ELECTRODE REM PT RTRN 9FT ADLT (ELECTROSURGICAL) ×1 IMPLANT
FILTER SMOKE EVAC LAPAROSHD (FILTER) IMPLANT
GLOVE BIO SURGEON STRL SZ7 (GLOVE) ×2 IMPLANT
GLOVE BIOGEL PI IND STRL 7.0 (GLOVE) ×3 IMPLANT
GLOVE BIOGEL PI IND STRL 7.5 (GLOVE) ×1 IMPLANT
GLOVE BIOGEL PI INDICATOR 7.0 (GLOVE) ×3
GLOVE BIOGEL PI INDICATOR 7.5 (GLOVE) ×1
GLOVE ECLIPSE 6.5 STRL STRAW (GLOVE) ×2 IMPLANT
GLOVE SURG SS PI 6.5 STRL IVOR (GLOVE) ×2 IMPLANT
GOWN STRL REUS W/ TWL LRG LVL3 (GOWN DISPOSABLE) ×3 IMPLANT
GOWN STRL REUS W/TWL LRG LVL3 (GOWN DISPOSABLE) ×6
HEMOSTAT SNOW SURGICEL 2X4 (HEMOSTASIS) IMPLANT
NS IRRIG 1000ML POUR BTL (IV SOLUTION) ×2 IMPLANT
PACK BASIN DAY SURGERY FS (CUSTOM PROCEDURE TRAY) ×2 IMPLANT
POUCH RETRIEVAL ECOSAC 10 (ENDOMECHANICALS) ×1 IMPLANT
POUCH RETRIEVAL ECOSAC 10MM (ENDOMECHANICALS) ×1
SCISSORS LAP 5X35 DISP (ENDOMECHANICALS) ×2 IMPLANT
SET CHOLANGIOGRAPH 5 50 .035 (SET/KITS/TRAYS/PACK) IMPLANT
SET IRRIG TUBING LAPAROSCOPIC (IRRIGATION / IRRIGATOR) ×2 IMPLANT
SLEEVE ENDOPATH XCEL 5M (ENDOMECHANICALS) ×4 IMPLANT
SLEEVE SCD COMPRESS KNEE MED (MISCELLANEOUS) ×2 IMPLANT
SPECIMEN JAR SMALL (MISCELLANEOUS) ×2 IMPLANT
STRIP CLOSURE SKIN 1/2X4 (GAUZE/BANDAGES/DRESSINGS) ×2 IMPLANT
SUT MNCRL AB 4-0 PS2 18 (SUTURE) ×2 IMPLANT
SUT VICRYL 0 UR6 27IN ABS (SUTURE) ×2 IMPLANT
TOWEL OR 17X24 6PK STRL BLUE (TOWEL DISPOSABLE) ×4 IMPLANT
TRAY LAPAROSCOPIC (CUSTOM PROCEDURE TRAY) ×2 IMPLANT
TROCAR XCEL BLUNT TIP 100MML (ENDOMECHANICALS) ×2 IMPLANT
TROCAR XCEL NON-BLD 5MMX100MML (ENDOMECHANICALS) ×2 IMPLANT
TUBE CONNECTING 20X1/4 (TUBING) ×2 IMPLANT
TUBING INSUFFLATION (TUBING) ×2 IMPLANT

## 2017-02-10 NOTE — Discharge Instructions (Signed)
[facility] LAPAROSCOPIC SURG[facility]: POST OP INSTRUCTIONS  Always review your discharge instruction sheet given to you by the facility where your surgery was performed. IF YOU HAVE DISABILITY OR FAMILY LEAVE FORMS, YOU MUST BRING THEM TO THE OFFICE FOR PROCESSING.   DO NOT GIVE THEM TO YOUR DOCTOR.  1. A prescription for pain medication may be given to you upon discharge.  Take your pain medication as prescribed, if needed.  If narcotic pain medicine is not needed, then you may take acetaminophen (Tylenol), naprosyn (Alleve), or ibuprofen (Advil) as needed. 2. Take your usually prescribed medications unless otherwise directed. 3. If you need a refill on your pain medication, please contact your pharmacy.  They will contact our office to request authorization. Prescriptions will not be filled after 5pm or on week-ends. 4. You should follow a light diet the first few days after arrival home, such as soup and crackers, etc.  Be sure to include lots of fluids daily. 5. Most patients will experience some swelling and bruising in the area of the incisions.  Ice packs will help.  Swelling and bruising can take several days to resolve.  6. It is common to experience some constipation if taking pain medication after surgery.  Increasing fluid intake and taking a stool softener (such as Colace) will usually help or prevent this problem from occurring.  A mild laxative (Milk of Magnesia or Miralax) should be taken according to package instructions if there are no bowel movements after 48 hours. 7. Unless discharge instructions indicate otherwise, you may remove your bandages 48 hours after surgery, and you may shower at that time.  You may have steri-strips (small skin tapes) in place directly over the incision.  These strips should be left on the skin for 7-10 days.  If your surgeon used skin glue on the incision, you may shower in 24 hours.  The glue will flake  off over the next 2-3 weeks.  Any sutures or staples will be removed at the office during your follow-up visit. 8. ACTIVITIES:  You may resume regular (light) daily activities beginning the next day--such as daily self-care, walking, climbing stairs--gradually increasing activities as tolerated.  You may have sexual intercourse when it is comfortable.  Refrain from any heavy lifting or straining until approved by your doctor. a. You may drive when you are no longer taking prescription pain medication, you can comfortably wear a seatbelt, and you can safely maneuver your car and apply brakes. b. RETURN TO WORK:  __________________________________________________________ 9. You should see your doctor in the office for a follow-up appointment approximately 2-3 weeks after your surgery.  Make sure that you call for this appointment within a day or two after you arrive home to insure a convenient appointment time. 10. OTHER INSTRUCTIONS: __________________________________________________________________________________________________________________________ __________________________________________________________________________________________________________________________ WHEN TO CALL YOUR DOCTOR: 1. Fever over 101.0 2. Inability to urinate 3. Continued bleeding from incision. 4. Increased pain, redness, or drainage from the incision. 5. Increasing abdominal pain  The clinic staff is available to answer your questions during regular business hours.  Please dont hesitate to call and ask to speak to one of the nurses for clinical concerns.  If you have a medical emergency, go to the nearest emergency room or call 911.  A surgeon from [facility] is always on call at the hospital. [address], [place], [place], [place]  [address] ? [address], [place], [place]   [address] [phone] ? [phone] ? FAX [phone]  FAX [phone] °Web site: [email or web address] ° °Post  Anesthesia Home Care Instructions ° °Activity: °Get plenty of rest for the remainder of the day. A responsible individual must stay with you for 24 hours following the procedure.  °For the next 24 hours, DO NOT: °-Drive a car °-Operate machinery °-Drink alcoholic beverages °-Take any medication unless instructed by your physician °-Make any legal decisions or sign important papers. ° °Meals: °Start with liquid foods such as gelatin or soup. Progress to regular foods as tolerated. Avoid greasy, spicy, heavy foods. If nausea and/or vomiting occur, drink only clear liquids until the nausea and/or vomiting subsides. Call your physician if vomiting continues. ° °Special Instructions/Symptoms: °Your throat may feel dry or sore from the anesthesia or the breathing tube placed in your throat during surgery. If this causes discomfort, gargle with warm salt water. The discomfort should disappear within 24 hours. ° °If you had a scopolamine patch placed behind your ear for the management of post- operative nausea and/or vomiting: ° °1. The medication in the patch is effective for 72 hours, after which it should be removed.  Wrap patch in a tissue and discard in the trash. Wash hands thoroughly with soap and water. °2. You may remove the patch earlier than 72 hours if you experience unpleasant side effects which may include dry mouth, dizziness or visual disturbances. °3. Avoid touching the patch. Wash your hands with soap and water after contact with the patch. °  ° ° °

## 2017-02-10 NOTE — Anesthesia Procedure Notes (Signed)
Procedure Name: Intubation Date/Time: 02/10/2017 10:25 AM Performed by: Maryella Shivers Pre-anesthesia Checklist: Patient identified, Emergency Drugs available, Suction available and Patient being monitored Patient Re-evaluated:Patient Re-evaluated prior to induction Oxygen Delivery Method: Circle system utilized Preoxygenation: Pre-oxygenation with 100% oxygen Induction Type: IV induction Ventilation: Mask ventilation without difficulty Laryngoscope Size: Mac and 3 Grade View: Grade I Tube type: Oral Tube size: 7.0 mm Number of attempts: 1 Airway Equipment and Method: Stylet and Oral airway Placement Confirmation: ETT inserted through vocal cords under direct vision,  positive ETCO2 and breath sounds checked- equal and bilateral Secured at: 21 cm Tube secured with: Tape Dental Injury: Teeth and Oropharynx as per pre-operative assessment

## 2017-02-10 NOTE — H&P (Signed)
36 yof here for evaluation of abdominal pain. 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 she is healthy and then in mid June developed acute onset abdominal pain, diarrhea. this was crampy pain and in upper abdomen. she eventually went to the er for this pain. she had a ct there that showed proximal colitis. she was treated with abx and this began to improve. she saw Dr Cristina Gong for routine egd due to gerd which was essentially normal. she said that he thought she might have ischemic colitis and stopped abx. she had gotten much better and then pain recurred. she began abx and got somewhat better again. then stopped with recurrence. she continued to have diarrhea. she then saw her pcp and was started on prednisone and this did make her feel better. she [facility] up [facility] and was scheduled for colonoscopy due to concern for uc. this was negative also. she has also undergone a normal ruq u/s and a normal hida scan although drinking the ensure did reproduce her symptoms. at this point her diarrhea is stopped. her pain continues but the last few episodes have occurred after eating. the worst was with pizza. these episodes are ruq radiating to the back associated with nausea. she is here today to discuss options. she has lost 14 lbs and does not have an appetite.   Past Surgical History Rebecca Pearson, [place]; 01/17/2017 11:46 AM) Shoulder Surgery  Right. Tonsillectomy   Diagnostic Studies History Rebecca Pearson, [place]; 01/17/2017 11:46 AM) Colonoscopy  within last year Mammogram  never Pap Smear  1-5 years ago  Allergies Rebecca Pearson, RMA; 01/17/2017 11:47 AM) No Known Allergies 01/17/2017  Medication History Rebecca Pearson, RMA; 01/17/2017 11:51 AM) Docusate Sodium (100MG  Capsule, Oral) Active. Norgestimate-Eth Estradiol (0.25-35MG -MCG Tablet, Oral) Active. OxyCODONE HCl (5MG  Tablet, Oral) Active. Promethazine HCl (12.5MG  Tablet, Oral)  Active. Propranolol HCl ER (120MG  Capsule ER 24HR, Oral) Active. RaNITidine HCl (150MG  Capsule, Oral) Active.  Social History Rebecca Pearson, [place]; 01/17/2017 11:46 AM) Alcohol use  Occasional alcohol use. Caffeine use  Coffee. No drug use  Tobacco use  Current some day smoker.  Family History Rebecca Pearson, [place]; 01/17/2017 11:46 AM) Arthritis  Mother. Cancer  Father. Depression  Mother. Migraine Headache  Mother.  Pregnancy / Birth History Rebecca Pearson, [place]; 01/17/2017 11:46 AM) Age at menarche  89 years. Gravida  2 Length (months) of breastfeeding  7-12 Maternal age  23-25 Para  2 Regular periods   Other Problems Rebecca Pearson, [place]; 01/17/2017 11:46 AM) Gastroesophageal Reflux Disease    Review of Systems Rebecca Pearson RMA; 01/17/2017 11:46 AM) General Present- Appetite Loss and Weight Loss. Not Present- Chills, Fatigue, Fever, Night Sweats and Weight Gain. Skin Not Present- Change in Wart/Mole, Dryness, Hives, Jaundice, New Lesions, Non-Healing Wounds, Rash and Ulcer. HEENT Present- Seasonal Allergies and Wears glasses/contact lenses. Not Present- Earache, Hearing Loss, Hoarseness, Nose Bleed, Oral Ulcers, Ringing in the Ears, Sinus Pain, Sore Throat, Visual Disturbances and Yellow Eyes. Respiratory Not Present- Bloody sputum, Chronic Cough, Difficulty Breathing, Snoring and Wheezing. Breast Not Present- Breast Mass, Breast Pain, Nipple Discharge and Skin Changes. Cardiovascular Not Present- Chest Pain, Difficulty Breathing Lying Down, Leg Cramps, Palpitations, Rapid Heart Rate, Shortness of Breath and Swelling of Extremities. Gastrointestinal Present- Abdominal Pain and Bloating. Not Present- Bloody Stool, Change in Bowel Habits, Chronic diarrhea, Constipation, Difficulty Swallowing, Excessive gas, Gets full quickly at meals, Hemorrhoids, Indigestion, Nausea, Rectal Pain and Vomiting. Female Genitourinary  Not Present- Frequency, Nocturia, Painful Urination,  Pelvic Pain and Urgency. Musculoskeletal Not Present- Back Pain, Joint Pain, Joint Stiffness, Muscle Pain, Muscle Weakness and Swelling of Extremities. Neurological Present- Headaches. Not Present- Decreased Memory, Fainting, Numbness, Seizures, Tingling, Tremor, Trouble walking and Weakness. Psychiatric Not Present- Anxiety, Bipolar, Change in Sleep Pattern, Depression, Fearful and Frequent crying. Endocrine Not Present- Cold Intolerance, Excessive Hunger, Hair Changes, Heat Intolerance, Hot flashes and New Diabetes. Hematology Not Present- Blood Thinners, Easy Bruising, Excessive bleeding, Gland problems, HIV and Persistent Infections.  Vitals Rebecca Pearson RMA; 01/17/2017 11:53 AM) 01/17/2017 11:52 AM Weight: 145.8 lb Height: 66in Body Surface Area: 1.75 m Body Mass Index: 23.53 kg/m  Temp.: 99.59F  Pulse: 97 (Regular)  BP: 110/72 (Sitting, Left Arm, Standard) Physical Exam Rolm Bookbinder MD; 01/17/2017 9:00 PM) General Mental Status-Alert. Orientation-Oriented X3. Eye Sclera/Conjunctiva - Bilateral-No scleral icterus. Chest and Lung Exam Chest and lung exam reveals -quiet, even and easy respiratory effort with no use of accessory muscles and on auscultation, normal breath sounds, no adventitious sounds and normal vocal resonance. Cardiovascular Cardiovascular examination reveals -normal heart sounds, regular rate and rhythm with no murmurs. Abdomen Note: soft mild tender epigastrium bs present   Assessment & Plan Rolm Bookbinder MD; 01/17/2017 5:42 PM) BILIARY COLIC (H06.23) Story: Laparoscopic cholecystectomy her symptoms now do appear to possibly be biliary although testing is negative. all other sources for most part have been ruled out she does have classic symptoms now (she did not before) and did have some pain during hida scan. there is no other test to figure this out and I think reasonable in her to consider cholecystectomy. I discussed lap  chole with her. I told her there is chance that this might not help at all. we discussed risks and recovery today. we will prcoeed soon

## 2017-02-10 NOTE — Transfer of Care (Signed)
Immediate Anesthesia Transfer of Care Note  Patient: Rebecca Pearson  Procedure(s) Performed: Procedure(s): LAPAROSCOPIC CHOLECYSTECTOMY (N/A)  Patient Location: PACU  Anesthesia Type:General  Level of Consciousness: awake, alert  and oriented  Airway & Oxygen Therapy: Patient Spontanous Breathing and Patient connected to face mask oxygen  Post-op Assessment: Report given to RN and Post -op Vital signs reviewed and stable  Post vital signs: Reviewed and stable  Last Vitals:  Vitals:   02/10/17 0946  BP: (!) 121/56  Pulse: 84  Resp: 16  Temp: 36.8 C  SpO2: 100%    Last Pain:  Vitals:   02/10/17 0946  TempSrc: Oral  PainSc:          Complications: No apparent anesthesia complications

## 2017-02-10 NOTE — Interval H&P Note (Signed)
History and Physical Interval Note:  02/10/2017 10:05 AM  Rebecca Pearson  has presented today for surgery, with the diagnosis of BILIARY COLIC   The various methods of treatment have been discussed with the patient and family. After consideration of risks, benefits and other options for treatment, the patient has consented to  Procedure(s): LAPAROSCOPIC CHOLECYSTECTOMY (N/A) as a surgical intervention .  The patient's history has been reviewed, patient examined, no change in status, stable for surgery.  I have reviewed the patient's chart and labs.  Questions were answered to the patient's satisfaction.     Chisom Aust

## 2017-02-10 NOTE — Anesthesia Postprocedure Evaluation (Signed)
Anesthesia Post Note  Patient: Rebecca Pearson  Procedure(s) Performed: Procedure(s) (LRB): LAPAROSCOPIC CHOLECYSTECTOMY (N/A)     Patient location during evaluation: PACU Anesthesia Type: General Level of consciousness: awake and alert Pain management: pain level controlled Vital Signs Assessment: post-procedure vital signs reviewed and stable Respiratory status: spontaneous breathing, nonlabored ventilation, respiratory function stable and patient connected to nasal cannula oxygen Cardiovascular status: blood pressure returned to baseline and stable Postop Assessment: no apparent nausea or vomiting Anesthetic complications: no    Last Vitals:  Vitals:   02/10/17 1226 02/10/17 1230  BP: 104/62 106/61  Pulse: 67 (!) 59  Resp: 14 13  Temp:    SpO2: 99% 98%    Last Pain:  Vitals:   02/10/17 1230  TempSrc:   PainSc: 5                  Xeng Kucher S

## 2017-02-10 NOTE — Op Note (Signed)
Preoperative diagnosis: biliary colic Postoperative diagnosis: same as above Procedure: Laparoscopic cholecystectomy Surgeon: Dr. Serita Grammes Anesthesia: Gen. Specimens: gb to pathology Estimated blood loss: minimal Complications: None Drains: none Sponge count was correct at completion Disposition to recovery stable  Indications: This is a 62 yof with symptoms consistent with biliary colic but no abnormal imaging. We discussed her options and have elected to proceed with lap chole understanding it might not cure her pain.   Procedure: After informed consent was obtained the patient was taken to the operating room. She was givenantibiotics. SCDs were in place. She was placed undergeneral anesthesia without complication. Her abdomen was prepped and draped in the standard sterile surgical fashion. A surgical timeout was then performed.  I infiltrated marcaine below the umbilicus. I made a vertical incision. I incised the fascia and entered the peritoneum bluntly. I placed a 0 vicryl pursestring suture and inserted a hasson trocar. I insufflated the abdomen to 15 mm Hg pressure. I then placed a 5 mm trocar in theepigastrium.Two 5 mm trocars were placed in the right side of the abdomen. I grasped the gallbladder and retracted cephalad and lateral. There was some inflammation in the triangle but otherwise the gallbladder looked fairly normal.   EventuallyI was able to identify the critical view of safety. I then clipped the duct and divided it. The duct was viable. The clips traversed the duct. I then clipped and divided the cystic artery. I then removed the gallbladder from the liver bed. I placed it in a bag and removed from the umbilicus .I obtained hemostasis. I did place a small piece of surgicel snow in the liver bed.  I then removed the 56mm trocar, tied down the pursestring and closed this with multiple2-0 vicryls using the endoclose device.I then removed the remaining trocars  and these were closed with 4-0 Monocryl and glue. She tolerated this well be transferred to the recovery room.

## 2017-02-10 NOTE — Anesthesia Preprocedure Evaluation (Signed)
Anesthesia Evaluation  Patient identified by MRN, date of birth, ID band Patient awake    Reviewed: Allergy & Precautions, NPO status , Patient's Chart, lab work & pertinent test results  History of Anesthesia Complications (+) PONV  Airway Mallampati: II  TM Distance: >3 FB Neck ROM: Full    Dental no notable dental hx.    Pulmonary neg pulmonary ROS, Current Smoker,    Pulmonary exam normal breath sounds clear to auscultation       Cardiovascular negative cardio ROS Normal cardiovascular exam Rhythm:Regular Rate:Normal     Neuro/Psych negative neurological ROS  negative psych ROS   GI/Hepatic Neg liver ROS, GERD  Medicated,  Endo/Other  negative endocrine ROS  Renal/GU negative Renal ROS  negative genitourinary   Musculoskeletal negative musculoskeletal ROS (+)   Abdominal   Peds negative pediatric ROS (+)  Hematology negative hematology ROS (+)   Anesthesia Other Findings   Reproductive/Obstetrics negative OB ROS                             Anesthesia Physical Anesthesia Plan  ASA: II  Anesthesia Plan: General   Post-op Pain Management:    Induction: Intravenous  PONV Risk Score and Plan: 3 and Ondansetron, Dexamethasone, Midazolam and Scopolamine patch - Pre-op  Airway Management Planned: Oral ETT  Additional Equipment:   Intra-op Plan:   Post-operative Plan: Extubation in OR  Informed Consent: I have reviewed the patients History and Physical, chart, labs and discussed the procedure including the risks, benefits and alternatives for the proposed anesthesia with the patient or authorized representative who has indicated his/her understanding and acceptance.   Dental advisory given  Plan Discussed with: CRNA and Surgeon  Anesthesia Plan Comments:         Anesthesia Quick Evaluation

## 2017-02-11 ENCOUNTER — Encounter (HOSPITAL_BASED_OUTPATIENT_CLINIC_OR_DEPARTMENT_OTHER): Payer: Self-pay | Admitting: General Surgery

## 2017-05-20 LAB — HM COLONOSCOPY

## 2017-12-05 ENCOUNTER — Other Ambulatory Visit: Payer: Self-pay | Admitting: Physician Assistant

## 2017-12-05 DIAGNOSIS — R1084 Generalized abdominal pain: Secondary | ICD-10-CM

## 2017-12-05 DIAGNOSIS — R933 Abnormal findings on diagnostic imaging of other parts of digestive tract: Secondary | ICD-10-CM

## 2017-12-05 DIAGNOSIS — R1033 Periumbilical pain: Secondary | ICD-10-CM

## 2017-12-24 ENCOUNTER — Other Ambulatory Visit: Payer: Self-pay

## 2017-12-24 ENCOUNTER — Ambulatory Visit
Admission: RE | Admit: 2017-12-24 | Discharge: 2017-12-24 | Disposition: A | Discharge status: Home / Self Care | Payer: 59 | Source: Ambulatory Visit | Attending: Physician Assistant | Admitting: Physician Assistant

## 2017-12-24 ENCOUNTER — Encounter: Payer: Self-pay | Admitting: Radiology

## 2017-12-24 DIAGNOSIS — R1084 Generalized abdominal pain: Secondary | ICD-10-CM

## 2017-12-24 DIAGNOSIS — R1033 Periumbilical pain: Secondary | ICD-10-CM

## 2017-12-24 DIAGNOSIS — R933 Abnormal findings on diagnostic imaging of other parts of digestive tract: Secondary | ICD-10-CM

## 2017-12-24 MED ORDER — IOPAMIDOL (ISOVUE-300) INJECTION 61%
100.0000 mL | Freq: Once | INTRAVENOUS | Status: AC | PRN
  Administered 2017-12-24: 100 mL via INTRAVENOUS

## 2019-07-22 ENCOUNTER — Ambulatory Visit: Payer: 59 | Attending: Internal Medicine

## 2019-07-22 DIAGNOSIS — Z23 Encounter for immunization: Secondary | ICD-10-CM | POA: Insufficient documentation

## 2019-07-22 NOTE — Progress Notes (Signed)
   Covid-19 Vaccination Clinic  Name:  BRENLIE CZAPLA    MRN: [id number] DOB: 1980/10/14  07/22/2019  Ms. Blessinger was observed post Covid-19 immunization for 15 minutes without incident. She was provided with Vaccine Information Sheet and instruction to access the V-Safe system.   Ms. Munck was instructed to call 911 with any severe reactions post vaccine: Marland Kitchen Difficulty breathing  . Swelling of face and throat  . A fast heartbeat  . A bad rash all over body  . Dizziness and weakness   Immunizations Administered    Name Date Dose VIS Date Route   Moderna COVID-19 Vaccine 07/22/2019 11:51 AM 0.5 mL 04/20/2019 Intramuscular   Manufacturer: Moderna   Lot: [id number]   [id number]

## 2019-08-24 ENCOUNTER — Ambulatory Visit: Payer: 59 | Attending: Internal Medicine

## 2019-08-24 DIAGNOSIS — Z23 Encounter for immunization: Secondary | ICD-10-CM

## 2019-08-24 NOTE — Progress Notes (Signed)
   Covid-19 Vaccination Clinic  Name:  Rebecca Pearson    MRN: [id number] DOB: Nov 04, 1980  08/24/2019  [name] was observed post Covid-19 immunization for 15 minutes without incident. She was provided with Vaccine Information Sheet and instruction to access the V-Safe system.   [name] was instructed to call 911 with any severe reactions post vaccine: Marland Kitchen Difficulty breathing  . Swelling of face and throat  . A fast heartbeat  . A bad rash all over body  . Dizziness and weakness   Immunizations Administered    Name Date Dose VIS Date Route   Moderna COVID-19 Vaccine 08/24/2019  9:59 AM 0.5 mL 04/20/2019 Intramuscular   Manufacturer: Moderna   LotHQ:[id number]   [place]:[id number]

## 2020-04-20 ENCOUNTER — Other Ambulatory Visit: Payer: Self-pay | Admitting: Obstetrics and Gynecology

## 2020-04-20 DIAGNOSIS — R928 Other abnormal and inconclusive findings on diagnostic imaging of breast: Secondary | ICD-10-CM

## 2020-04-22 ENCOUNTER — Ambulatory Visit
Admission: RE | Admit: 2020-04-22 | Discharge: 2020-04-22 | Disposition: A | Discharge status: Home / Self Care | Payer: 59 | Source: Ambulatory Visit | Attending: Obstetrics and Gynecology | Admitting: Obstetrics and Gynecology

## 2020-04-22 ENCOUNTER — Other Ambulatory Visit: Payer: Self-pay | Admitting: Obstetrics and Gynecology

## 2020-04-22 ENCOUNTER — Other Ambulatory Visit: Payer: Self-pay

## 2020-04-22 DIAGNOSIS — R928 Other abnormal and inconclusive findings on diagnostic imaging of breast: Secondary | ICD-10-CM

## 2020-04-24 ENCOUNTER — Other Ambulatory Visit: Payer: Self-pay | Admitting: Obstetrics and Gynecology

## 2020-04-24 DIAGNOSIS — N6320 Unspecified lump in the left breast, unspecified quadrant: Secondary | ICD-10-CM

## 2020-04-25 ENCOUNTER — Ambulatory Visit
Admission: RE | Admit: 2020-04-25 | Discharge: 2020-04-25 | Disposition: A | Discharge status: Home / Self Care | Payer: 59 | Source: Ambulatory Visit | Attending: Obstetrics and Gynecology | Admitting: Obstetrics and Gynecology

## 2020-04-25 ENCOUNTER — Other Ambulatory Visit: Payer: Self-pay

## 2020-04-25 DIAGNOSIS — N6320 Unspecified lump in the left breast, unspecified quadrant: Secondary | ICD-10-CM

## 2020-04-28 ENCOUNTER — Other Ambulatory Visit: Payer: 59

## 2020-05-01 ENCOUNTER — Other Ambulatory Visit: Payer: Self-pay | Admitting: General Surgery

## 2020-05-01 DIAGNOSIS — N6489 Other specified disorders of breast: Secondary | ICD-10-CM

## 2020-05-02 ENCOUNTER — Other Ambulatory Visit: Payer: Self-pay | Admitting: General Surgery

## 2020-05-02 DIAGNOSIS — N6489 Other specified disorders of breast: Secondary | ICD-10-CM

## 2020-05-09 ENCOUNTER — Other Ambulatory Visit: Payer: Self-pay

## 2020-05-09 ENCOUNTER — Encounter (HOSPITAL_BASED_OUTPATIENT_CLINIC_OR_DEPARTMENT_OTHER): Payer: Self-pay | Admitting: General Surgery

## 2020-05-15 ENCOUNTER — Other Ambulatory Visit (HOSPITAL_COMMUNITY)
Admission: RE | Admit: 2020-05-15 | Discharge: 2020-05-15 | Disposition: A | Discharge status: Home / Self Care | Payer: 59 | Source: Ambulatory Visit | Attending: General Surgery | Admitting: General Surgery

## 2020-05-15 DIAGNOSIS — Z01812 Encounter for preprocedural laboratory examination: Secondary | ICD-10-CM | POA: Diagnosis present

## 2020-05-15 DIAGNOSIS — Z20822 Contact with and (suspected) exposure to covid-19: Secondary | ICD-10-CM | POA: Insufficient documentation

## 2020-05-16 LAB — SARS CORONAVIRUS 2 (TAT 6-24 HRS): SARS Coronavirus 2: NEGATIVE

## 2020-05-17 ENCOUNTER — Ambulatory Visit
Admission: RE | Admit: 2020-05-17 | Discharge: 2020-05-17 | Disposition: A | Discharge status: Home / Self Care | Payer: 59 | Source: Ambulatory Visit | Attending: General Surgery | Admitting: General Surgery

## 2020-05-17 ENCOUNTER — Other Ambulatory Visit: Payer: Self-pay

## 2020-05-17 ENCOUNTER — Other Ambulatory Visit: Payer: Self-pay | Admitting: General Surgery

## 2020-05-17 DIAGNOSIS — N6489 Other specified disorders of breast: Secondary | ICD-10-CM

## 2020-05-17 MED ORDER — ENSURE PRE-SURGERY PO LIQD: 296.0000 mL | Freq: Once | ORAL | Status: DC

## 2020-05-17 NOTE — Progress Notes (Signed)

## 2020-05-18 ENCOUNTER — Other Ambulatory Visit: Payer: Self-pay

## 2020-05-18 ENCOUNTER — Ambulatory Visit (HOSPITAL_BASED_OUTPATIENT_CLINIC_OR_DEPARTMENT_OTHER): Payer: 59 | Admitting: Anesthesiology

## 2020-05-18 ENCOUNTER — Ambulatory Visit
Admission: RE | Admit: 2020-05-18 | Discharge: 2020-05-18 | Disposition: A | Discharge status: Home / Self Care | Payer: 59 | Source: Ambulatory Visit | Attending: General Surgery | Admitting: General Surgery

## 2020-05-18 ENCOUNTER — Ambulatory Visit (HOSPITAL_BASED_OUTPATIENT_CLINIC_OR_DEPARTMENT_OTHER)
Admission: RE | Admit: 2020-05-18 | Discharge: 2020-05-18 | Disposition: A | Discharge status: Home / Self Care | Payer: 59 | Attending: General Surgery | Admitting: General Surgery

## 2020-05-18 ENCOUNTER — Encounter (HOSPITAL_BASED_OUTPATIENT_CLINIC_OR_DEPARTMENT_OTHER)
Admission: RE | Disposition: A | Discharge status: Home / Self Care | Payer: Self-pay | Source: Home / Self Care | Attending: General Surgery

## 2020-05-18 ENCOUNTER — Encounter (HOSPITAL_BASED_OUTPATIENT_CLINIC_OR_DEPARTMENT_OTHER): Payer: Self-pay | Admitting: General Surgery

## 2020-05-18 DIAGNOSIS — N6489 Other specified disorders of breast: Secondary | ICD-10-CM

## 2020-05-18 DIAGNOSIS — Z79899 Other long term (current) drug therapy: Secondary | ICD-10-CM | POA: Insufficient documentation

## 2020-05-18 DIAGNOSIS — N6320 Unspecified lump in the left breast, unspecified quadrant: Secondary | ICD-10-CM | POA: Diagnosis present

## 2020-05-18 DIAGNOSIS — F17200 Nicotine dependence, unspecified, uncomplicated: Secondary | ICD-10-CM | POA: Insufficient documentation

## 2020-05-18 HISTORY — PX: RADIOACTIVE SEED GUIDED EXCISIONAL BREAST BIOPSY: SHX6490

## 2020-05-18 LAB — POCT PREGNANCY, URINE: Preg Test, Ur: NEGATIVE

## 2020-05-18 SURGERY — RADIOACTIVE SEED GUIDED BREAST BIOPSY
Anesthesia: General | Site: Breast | Laterality: Left

## 2020-05-18 MED ORDER — CELECOXIB 200 MG PO CAPS
ORAL_CAPSULE | ORAL | Status: AC
  Filled 2020-05-18: qty 1

## 2020-05-18 MED ORDER — SCOPOLAMINE 1 MG/3DAYS TD PT72
MEDICATED_PATCH | TRANSDERMAL | Status: AC
  Filled 2020-05-18: qty 1

## 2020-05-18 MED ORDER — SCOPOLAMINE 1 MG/3DAYS TD PT72
MEDICATED_PATCH | TRANSDERMAL | Status: DC | PRN
  Administered 2020-05-18: 1 via TRANSDERMAL

## 2020-05-18 MED ORDER — ACETAMINOPHEN 500 MG PO TABS
ORAL_TABLET | ORAL | Status: AC
  Filled 2020-05-18: qty 2

## 2020-05-18 MED ORDER — FENTANYL CITRATE (PF) 100 MCG/2ML IJ SOLN
INTRAMUSCULAR | Status: DC | PRN
  Administered 2020-05-18: 50 ug via INTRAVENOUS
  Administered 2020-05-18: 25 ug via INTRAVENOUS

## 2020-05-18 MED ORDER — FENTANYL CITRATE (PF) 100 MCG/2ML IJ SOLN
INTRAMUSCULAR | Status: AC
  Filled 2020-05-18: qty 2

## 2020-05-18 MED ORDER — PROPOFOL 10 MG/ML IV BOLUS
INTRAVENOUS | Status: AC
  Filled 2020-05-18: qty 20

## 2020-05-18 MED ORDER — LIDOCAINE 2% (20 MG/ML) 5 ML SYRINGE
INTRAMUSCULAR | Status: DC | PRN
  Administered 2020-05-18: 80 mg via INTRAVENOUS

## 2020-05-18 MED ORDER — LIDOCAINE 2% (20 MG/ML) 5 ML SYRINGE
INTRAMUSCULAR | Status: AC
  Filled 2020-05-18: qty 5

## 2020-05-18 MED ORDER — BUPIVACAINE HCL (PF) 0.25 % IJ SOLN
INTRAMUSCULAR | Status: AC
  Filled 2020-05-18: qty 30

## 2020-05-18 MED ORDER — OXYCODONE HCL 5 MG PO TABS
ORAL_TABLET | ORAL | Status: AC
  Filled 2020-05-18: qty 1

## 2020-05-18 MED ORDER — PROPOFOL 10 MG/ML IV BOLUS
INTRAVENOUS | Status: DC | PRN
  Administered 2020-05-18: 180 mg via INTRAVENOUS

## 2020-05-18 MED ORDER — MIDAZOLAM HCL 2 MG/2ML IJ SOLN
INTRAMUSCULAR | Status: AC
  Filled 2020-05-18: qty 2

## 2020-05-18 MED ORDER — OXYCODONE HCL 5 MG PO TABS
5.0000 mg | ORAL_TABLET | Freq: Once | ORAL | Status: AC | PRN
  Administered 2020-05-18: 5 mg via ORAL

## 2020-05-18 MED ORDER — FENTANYL CITRATE (PF) 100 MCG/2ML IJ SOLN
25.0000 ug | INTRAMUSCULAR | Status: DC | PRN
  Administered 2020-05-18: 50 ug via INTRAVENOUS
  Administered 2020-05-18 (×2): 25 ug via INTRAVENOUS

## 2020-05-18 MED ORDER — DEXAMETHASONE SODIUM PHOSPHATE 4 MG/ML IJ SOLN
INTRAMUSCULAR | Status: DC | PRN
  Administered 2020-05-18: 10 mg via INTRAVENOUS

## 2020-05-18 MED ORDER — MIDAZOLAM HCL 5 MG/5ML IJ SOLN
INTRAMUSCULAR | Status: DC | PRN
  Administered 2020-05-18: 2 mg via INTRAVENOUS

## 2020-05-18 MED ORDER — BUPIVACAINE HCL (PF) 0.25 % IJ SOLN
INTRAMUSCULAR | Status: DC | PRN
  Administered 2020-05-18: 10 mL

## 2020-05-18 MED ORDER — DEXAMETHASONE SODIUM PHOSPHATE 10 MG/ML IJ SOLN
INTRAMUSCULAR | Status: AC
  Filled 2020-05-18: qty 1

## 2020-05-18 MED ORDER — CEFAZOLIN SODIUM-DEXTROSE 2-4 GM/100ML-% IV SOLN
INTRAVENOUS | Status: AC
  Filled 2020-05-18: qty 100

## 2020-05-18 MED ORDER — TRAMADOL HCL 50 MG PO TABS: 50.0000 mg | ORAL_TABLET | Freq: Four times a day (QID) | ORAL | 0 refills | Status: DC | PRN

## 2020-05-18 MED ORDER — CELECOXIB 200 MG PO CAPS
400.0000 mg | ORAL_CAPSULE | ORAL | Status: AC
  Administered 2020-05-18: 400 mg via ORAL

## 2020-05-18 MED ORDER — AMISULPRIDE (ANTIEMETIC) 5 MG/2ML IV SOLN: 10.0000 mg | Freq: Once | INTRAVENOUS | Status: DC | PRN

## 2020-05-18 MED ORDER — DIPHENHYDRAMINE HCL 50 MG/ML IJ SOLN
INTRAMUSCULAR | Status: DC | PRN
  Administered 2020-05-18: 12.5 mg via INTRAVENOUS

## 2020-05-18 MED ORDER — ONDANSETRON HCL 4 MG/2ML IJ SOLN
INTRAMUSCULAR | Status: AC
  Filled 2020-05-18: qty 2

## 2020-05-18 MED ORDER — LACTATED RINGERS IV SOLN: INTRAVENOUS | Status: DC

## 2020-05-18 MED ORDER — ACETAMINOPHEN 500 MG PO TABS
1000.0000 mg | ORAL_TABLET | ORAL | Status: AC
  Administered 2020-05-18: 1000 mg via ORAL

## 2020-05-18 MED ORDER — ONDANSETRON HCL 4 MG/2ML IJ SOLN
INTRAMUSCULAR | Status: DC | PRN
  Administered 2020-05-18: 4 mg via INTRAVENOUS

## 2020-05-18 MED ORDER — CELECOXIB 200 MG PO CAPS
ORAL_CAPSULE | ORAL | Status: AC
  Filled 2020-05-18: qty 2

## 2020-05-18 MED ORDER — ONDANSETRON HCL 4 MG/2ML IJ SOLN: 4.0000 mg | Freq: Once | INTRAMUSCULAR | Status: DC | PRN

## 2020-05-18 MED ORDER — OXYCODONE HCL 5 MG/5ML PO SOLN: 5.0000 mg | Freq: Once | ORAL | Status: AC | PRN

## 2020-05-18 MED ORDER — CEFAZOLIN SODIUM-DEXTROSE 2-4 GM/100ML-% IV SOLN
2.0000 g | INTRAVENOUS | Status: AC
  Administered 2020-05-18: 2 g via INTRAVENOUS

## 2020-05-18 SURGICAL SUPPLY — 58 items
ADH SKN CLS APL DERMABOND .7 (GAUZE/BANDAGES/DRESSINGS) ×1
APL PRP STRL LF DISP 70% ISPRP (MISCELLANEOUS) ×1
APPLIER CLIP 9.375 MED OPEN (MISCELLANEOUS)
APR CLP MED 9.3 20 MLT OPN (MISCELLANEOUS)
BINDER BREAST LRG (GAUZE/BANDAGES/DRESSINGS) IMPLANT
BINDER BREAST MEDIUM (GAUZE/BANDAGES/DRESSINGS) IMPLANT
BINDER BREAST XLRG (GAUZE/BANDAGES/DRESSINGS) ×2 IMPLANT
BINDER BREAST XXLRG (GAUZE/BANDAGES/DRESSINGS) IMPLANT
BLADE SURG 15 STRL LF DISP TIS (BLADE) ×1 IMPLANT
BLADE SURG 15 STRL SS (BLADE) ×2
CANISTER SUC SOCK COL 7IN (MISCELLANEOUS) IMPLANT
CANISTER SUCT 1200ML W/VALVE (MISCELLANEOUS) IMPLANT
CHLORAPREP W/TINT 26 (MISCELLANEOUS) ×2 IMPLANT
CLIP APPLIE 9.375 MED OPEN (MISCELLANEOUS) IMPLANT
CLIP VESOCCLUDE SM WIDE 6/CT (CLIP) IMPLANT
COVER BACK TABLE 60X90IN (DRAPES) ×2 IMPLANT
COVER MAYO STAND STRL (DRAPES) ×2 IMPLANT
COVER PROBE W GEL 5X96 (DRAPES) IMPLANT
COVER WAND RF STERILE (DRAPES) IMPLANT
DECANTER SPIKE VIAL GLASS SM (MISCELLANEOUS) IMPLANT
DERMABOND ADVANCED (GAUZE/BANDAGES/DRESSINGS) ×1
DERMABOND ADVANCED .7 DNX12 (GAUZE/BANDAGES/DRESSINGS) ×1 IMPLANT
DRAPE LAPAROSCOPIC ABDOMINAL (DRAPES) ×2 IMPLANT
DRAPE UTILITY XL STRL (DRAPES) ×2 IMPLANT
DRSG TEGADERM 4X4.75 (GAUZE/BANDAGES/DRESSINGS) IMPLANT
ELECT COATED BLADE 2.86 ST (ELECTRODE) ×2 IMPLANT
ELECT REM PT RETURN 9FT ADLT (ELECTROSURGICAL) ×2
ELECTRODE REM PT RTRN 9FT ADLT (ELECTROSURGICAL) ×1 IMPLANT
GAUZE SPONGE 4X4 12PLY STRL LF (GAUZE/BANDAGES/DRESSINGS) IMPLANT
GLOVE BIO SURGEON STRL SZ 6.5 (GLOVE) ×2 IMPLANT
GLOVE BIOGEL PI IND STRL 7.5 (GLOVE) ×1 IMPLANT
GLOVE BIOGEL PI INDICATOR 7.5 (GLOVE) ×1
GLOVE SURG ENC MOIS LTX SZ7 (GLOVE) ×2 IMPLANT
GLOVE SURG UNDER POLY LF SZ7 (GLOVE) ×2 IMPLANT
GOWN STRL REUS W/ TWL LRG LVL3 (GOWN DISPOSABLE) ×2 IMPLANT
GOWN STRL REUS W/TWL LRG LVL3 (GOWN DISPOSABLE) ×4
HEMOSTAT ARISTA ABSORB 3G PWDR (HEMOSTASIS) IMPLANT
KIT MARKER MARGIN INK (KITS) ×2 IMPLANT
NEEDLE HYPO 25X1 1.5 SAFETY (NEEDLE) ×2 IMPLANT
NS IRRIG 1000ML POUR BTL (IV SOLUTION) IMPLANT
PACK BASIN DAY SURGERY FS (CUSTOM PROCEDURE TRAY) ×2 IMPLANT
PENCIL SMOKE EVACUATOR (MISCELLANEOUS) ×2 IMPLANT
RETRACTOR ONETRAX LX 90X20 (MISCELLANEOUS) IMPLANT
SLEEVE SCD COMPRESS KNEE MED (MISCELLANEOUS) ×2 IMPLANT
SPONGE LAP 4X18 RFD (DISPOSABLE) ×2 IMPLANT
STRIP CLOSURE SKIN 1/2X4 (GAUZE/BANDAGES/DRESSINGS) ×2 IMPLANT
SUT MNCRL AB 4-0 PS2 18 (SUTURE) IMPLANT
SUT MON AB 5-0 PS2 18 (SUTURE) ×2 IMPLANT
SUT SILK 2 0 SH (SUTURE) IMPLANT
SUT VIC AB 2-0 SH 27 (SUTURE) ×2
SUT VIC AB 2-0 SH 27XBRD (SUTURE) ×1 IMPLANT
SUT VIC AB 3-0 SH 27 (SUTURE) ×2
SUT VIC AB 3-0 SH 27X BRD (SUTURE) ×1 IMPLANT
SYR CONTROL 10ML LL (SYRINGE) ×2 IMPLANT
TOWEL GREEN STERILE FF (TOWEL DISPOSABLE) ×2 IMPLANT
TRAY FAXITRON CT DISP (TRAY / TRAY PROCEDURE) ×2 IMPLANT
TUBE CONNECTING 20X1/4 (TUBING) IMPLANT
YANKAUER SUCT BULB TIP NO VENT (SUCTIONS) IMPLANT

## 2020-05-18 NOTE — Anesthesia Procedure Notes (Signed)
Procedure Name: LMA Insertion Date/Time: 05/18/2020 11:55 AM Performed by: Burna Cash, CRNA Pre-anesthesia Checklist: Patient identified, Emergency Drugs available, Suction available and Patient being monitored Patient Re-evaluated:Patient Re-evaluated prior to induction Oxygen Delivery Method: Circle system utilized Preoxygenation: Pre-oxygenation with 100% oxygen Induction Type: IV induction Ventilation: Mask ventilation without difficulty LMA: LMA inserted LMA Size: 4.0 Number of attempts: 1 Airway Equipment and Method: Bite block Placement Confirmation: positive ETCO2 Tube secured with: Tape Dental Injury: Teeth and Oropharynx as per pre-operative assessment

## 2020-05-18 NOTE — H&P (Signed)
  39 yof with no prior breast history who underwent screening mm (her first) with finding of c density breasts. she has cysts on the right side confirmed by [place]. on the left side she has a distortion that measures 2.2x1.5x1.1 cm on [place], there is no adenopathy. US guided biopsy is a csl. she is here to discuss options  Past Surgical History Emelia Loron, MD; 05/01/2020 7:38 PM) Shoulder Surgery  Right. Tonsillectomy   Allergies Rosezella [place], RN; 05/01/2020 1:58 PM) No Known Allergies  [01/17/2017]: Allergies Reconciled   Medication History (Diane Herrin, RN; 05/01/2020 2:00 PM) Protonix (40MG  Tablet DR, Oral) Active. Carafate (1GM Tablet, Oral) Active. Colestipol HCl (1GM Tablet, Oral) Active. Hyoscyamine Sulfate (0.125MG  Tab Sublingual, Sublingual) Active. ZyrTEC Allergy (10MG  Capsule, Oral) Active. Nitrostat (0.4MG  Tab Sublingual, Sublingual) Active. (for GI) Pepcid (20MG  Tablet, Oral) Active. Medications Reconciled  Social History , [place]; 05/01/2020 7:38 PM) Alcohol use  Occasional alcohol use. Caffeine use  Coffee. No drug use  Tobacco use  Current some day smoker.  Family History , [place]; 05/01/2020 7:38 PM) Arthritis  Mother. Cancer  Father. Depression  Mother. Migraine Headache  Mother.  Vitals (Diane Herrin RN; 05/01/2020 2:01 PM) 05/01/2020 2:00 PM Weight: 191.13 lb Height: 66in Body Surface Area: 1.96 m Body Mass Index: 30.85 kg/m  Temp.: 98.56F  Pulse: 101 (Regular)  P.OX: 98% (Room air) BP: 128/80(Sitting, Left Arm, Standard)  Physical Exam 05/03/2020 MD; 05/01/2020 7:35 PM) General Mental Status-Alert. Orientation-Oriented X3. Breast Nipples-No Discharge. Breast Lump-No Palpable Breast Mass. Lymphatic Head & Neck General Head & Neck Lymphatics: Bilateral - Description - Normal. Axillary General Axillary Region: Bilateral - Description - Normal. Note: no [place]  adenopathy   Assessment & Plan 05/03/2020 MD; 05/01/2020 7:37 PM) RADIAL SCAR OF LEFT BREAST (N64.89) Story: Left breast seed guided excisional biopsy I discussed finding and upgrade rate of up to 18% based on local data. recommendation for excision discussed. surgery, seed placement, recovery discussed. will proceed soon

## 2020-05-18 NOTE — Interval H&P Note (Signed)
History and Physical Interval Note:  05/18/2020 11:35 AM  Rebecca Pearson  has presented today for surgery, with the diagnosis of LEFT BREAST MASS.  The various methods of treatment have been discussed with the patient and family. After consideration of risks, benefits and other options for treatment, the patient has consented to  Procedure(s): LEFT RADIOACTIVE SEED GUIDED EXCISIONAL BREAST BIOPSY (Left) as a surgical intervention.  The patient's history has been reviewed, patient examined, no change in status, stable for surgery.  I have reviewed the patient's chart and labs.  Questions were answered to the patient's satisfaction.     Emelia Loron

## 2020-05-18 NOTE — Discharge Instructions (Signed)
[facility] Office Phone Number [phone]  BREAST BIOPSY/ PARTIAL MASTECTOMY: POST OP INSTRUCTIONS Take 400 mg of ibuprofen every 8 hours or 650 mg tylenol every 6 hours for next 72 hours then as needed. Use ice several times daily also. Always review your discharge instruction sheet given to you by the facility where your surgery was performed.  IF YOU HAVE DISABILITY OR FAMILY LEAVE FORMS, YOU MUST BRING THEM TO THE OFFICE FOR PROCESSING.  DO NOT GIVE THEM TO YOUR DOCTOR.  1. A prescription for pain medication may be given to you upon discharge.  Take your pain medication as prescribed, if needed.  If narcotic pain medicine is not needed, then you may take acetaminophen (Tylenol), naprosyn (Alleve) or ibuprofen (Advil) as needed. 2. Take your usually prescribed medications unless otherwise directed 3. If you need a refill on your pain medication, please contact your pharmacy.  They will contact our office to request authorization.  Prescriptions will not be filled after 5pm or on week-ends. 4. You should eat very light the first 24 hours after surgery, such as soup, crackers, pudding, etc.  Resume your normal diet the day after surgery. 5. Most patients will experience some swelling and bruising in the breast.  Ice packs and a good support bra will help.  Wear the breast binder provided or a sports bra for 72 hours day and night.  After that wear a sports bra during the day until you return to the office. Swelling and bruising can take several days to resolve.  6. It is common to experience some constipation if taking pain medication after surgery.  Increasing fluid intake and taking a stool softener will usually help or prevent this problem from occurring.  A mild laxative (Milk of Magnesia or Miralax) should be taken according to package directions if there are no bowel movements after 48 hours. 7. Unless discharge instructions indicate otherwise, you may remove your bandages 48  hours after surgery and you may shower at that time.  You may have steri-strips (small skin tapes) in place directly over the incision.  These strips should be left on the skin for 7-10 days and will come off on their own.  If your surgeon used skin glue on the incision, you may shower in 24 hours.  The glue will flake off over the next 2-3 weeks.  Any sutures or staples will be removed at the office during your follow-up visit. 8. ACTIVITIES:  You may resume regular daily activities (gradually increasing) beginning the next day.  Wearing a good support bra or sports bra minimizes pain and swelling.  You may have sexual intercourse when it is comfortable. a. You may drive when you no longer are taking prescription pain medication, you can comfortably wear a seatbelt, and you can safely maneuver your car and apply brakes. b. RETURN TO WORK:  ______________________________________________________________________________________ 9. You should see your doctor in the office for a follow-up appointment approximately two weeks after your surgery.  Your doctor's nurse will typically make your follow-up appointment when she calls you with your pathology report.  Expect your pathology report 3-4 business days after your surgery.  You may call to check if you do not hear from us after three days. 10. OTHER INSTRUCTIONS: _______________________________________________________________________________________________ _____________________________________________________________________________________________________________________________________ _____________________________________________________________________________________________________________________________________ _____________________________________________________________________________________________________________________________________  WHEN TO CALL DR Kelda Azad: 1. Fever over 101.0 2. Nausea and/or vomiting. 3. Extreme swelling or  bruising. 4. Continued bleeding from incision. 5. Increased pain, redness, or drainage from the incision.  The clinic   staff is available to answer your questions during regular business hours.  Please don't hesitate to call and ask to speak to one of the nurses for clinical concerns.  If you have a medical emergency, go to the nearest emergency room or call 911.  A surgeon from [facility] is always on call at the hospital.  For further questions, please visit [email or web address]    Next dose of Tylenol can be given after 5:00PM. Next dose of NSAID (Ibuprofen, Aleve, Motrin) can be given after 5:00PM.    Post Anesthesia Home Care Instructions  Activity: Get plenty of rest for the remainder of the day. A responsible individual must stay with you for 24 hours following the procedure.  For the next 24 hours, DO NOT: -Drive a car -Advertising copywriter -Drink alcoholic beverages -Take any medication unless instructed by your physician -Make any legal decisions or sign important papers.  Meals: Start with liquid foods such as gelatin or soup. Progress to regular foods as tolerated. Avoid greasy, spicy, heavy foods. If nausea and/or vomiting occur, drink only clear liquids until the nausea and/or vomiting subsides. Call your physician if vomiting continues.  Special Instructions/Symptoms: Your throat may feel dry or sore from the anesthesia or the breathing tube placed in your throat during surgery. If this causes discomfort, gargle with warm salt water. The discomfort should disappear within 24 hours.  If you had a scopolamine patch placed behind your ear for the management of post- operative nausea and/or vomiting:  1. The medication in the patch is effective for 72 hours, after which it should be removed.  Wrap patch in a tissue and discard in the trash. Wash hands thoroughly with soap and water. 2. You may remove the patch earlier than 72 hours if you experience  unpleasant side effects which may include dry mouth, dizziness or visual disturbances. 3. Avoid touching the patch. Wash your hands with soap and water after contact with the patch.

## 2020-05-18 NOTE — Transfer of Care (Signed)
Immediate Anesthesia Transfer of Care Note  Patient: Rebecca Pearson  Procedure(s) Performed: LEFT RADIOACTIVE SEED GUIDED EXCISIONAL BREAST BIOPSY (Left Breast)  Patient Location: PACU  Anesthesia Type:General  Level of Consciousness: sedated  Airway & Oxygen Therapy: Patient Spontanous Breathing and Patient connected to face mask oxygen  Post-op Assessment: Report given to RN and Post -op Vital signs reviewed and stable  Post vital signs: Reviewed and stable  Last Vitals:  Vitals Value Taken Time  BP 100/72 05/18/20 1236  Temp    Pulse 64 05/18/20 1239  Resp 23 05/18/20 1239  SpO2 100 % 05/18/20 1239  Vitals shown include unvalidated device data.  Last Pain:  Vitals:   05/18/20 1058  TempSrc: Oral  PainSc: 0-No pain      Patients Stated Pain Goal: 4 (05/18/20 1058)  Complications: No complications documented.

## 2020-05-18 NOTE — Anesthesia Preprocedure Evaluation (Signed)
Anesthesia Evaluation  Patient identified by MRN, date of birth, ID band Patient awake    Reviewed: Allergy & Precautions, NPO status , Patient's Chart, lab work & pertinent test results  History of Anesthesia Complications (+) PONV and history of anesthetic complications  Airway Mallampati: II  TM Distance: >3 FB Neck ROM: Full    Dental no notable dental hx. (+) Teeth Intact   Pulmonary former smoker,    Pulmonary exam normal breath sounds clear to auscultation       Cardiovascular negative cardio ROS Normal cardiovascular exam Rhythm:Regular Rate:Normal     Neuro/Psych negative neurological ROS  negative psych ROS   GI/Hepatic Neg liver ROS, GERD  Medicated and Controlled,Hx/o esophageal spasm   Endo/Other  Left breast mass Obesity  Renal/GU negative Renal ROS  negative genitourinary   Musculoskeletal negative musculoskeletal ROS (+)   Abdominal (+) + obese,   Peds  Hematology negative hematology ROS (+)   Anesthesia Other Findings   Reproductive/Obstetrics                             Anesthesia Physical Anesthesia Plan  ASA: II  Anesthesia Plan: General   Post-op Pain Management:    Induction: Intravenous  PONV Risk Score and Plan: 4 or greater and Scopolamine patch - Pre-op, Midazolam, Ondansetron, Dexamethasone, Diphenhydramine and Treatment may vary due to age or medical condition  Airway Management Planned: LMA  Additional Equipment:   Intra-op Plan:   Post-operative Plan: Extubation in OR  Informed Consent: I have reviewed the patients History and Physical, chart, labs and discussed the procedure including the risks, benefits and alternatives for the proposed anesthesia with the patient or authorized representative who has indicated his/her understanding and acceptance.     Dental advisory given  Plan Discussed with: CRNA and Anesthesiologist  Anesthesia  Plan Comments:         Anesthesia Quick Evaluation

## 2020-05-18 NOTE — Op Note (Signed)
Preoperative diagnosis: Left breast mammographic distortion Postoperative diagnosis: Same as above Procedure: Left breast radioactive seed guided excisional biopsy Surgeon: Dr. Harden Mo Anesthesia: General Specimens: Left breast tissue containing seed and clip marked with paint Estimated blood loss: Minimal Drains: None Complications: None Sponge needle count was correct at completion Decision to recovery in stable condition  Indications:39 yof with no prior breast history who underwent screening mm (her first) with finding of c density breasts. she has cysts on the right side confirmed by [place]. on the left side she has a distortion that measures 2.2x1.5x1.1 cm on [place], there is no adenopathy. US guided biopsy is a csl.  We discussed options and elected to proceed with a seed guided excisional biopsy  Procedure: After informed consent was obtained the patient was taken to the operating room.  I had her mammograms available in the OR.  She was given antibiotics.  SCDs were in place.  She was placed under general anesthesia with an LMA.  She was prepped and draped in the standard sterile surgical fashion.  A surgical timeout was then performed.  Identified the seed in the central breast.  I then infiltrated Marcaine throughout this area.  I made a periareolar incision to hide the scar later.  I then used the neoprobe to dissect to the seed and remove the seed and some surrounding tissue.  I painted the specimen.  Mammogram confirmed removal of the clip and the seed.  I then obtained hemostasis.  I then closed the breast tissue with 2-0 Vicryl.  The skin was closed with 3-0 Vicryl 5-0 Monocryl.  Glue and Steri-Strips were applied.  She tolerated this well was extubated and transferred to recovery stable.

## 2020-05-18 NOTE — Anesthesia Postprocedure Evaluation (Signed)
Anesthesia Post Note  Patient: Rebecca Pearson  Procedure(s) Performed: LEFT RADIOACTIVE SEED GUIDED EXCISIONAL BREAST BIOPSY (Left Breast)     Patient location during evaluation: PACU Anesthesia Type: General Level of consciousness: awake and alert and oriented Pain management: pain level controlled Vital Signs Assessment: post-procedure vital signs reviewed and stable Respiratory status: spontaneous breathing, nonlabored ventilation and respiratory function stable Cardiovascular status: blood pressure returned to baseline and stable Postop Assessment: no apparent nausea or vomiting Anesthetic complications: no   No complications documented.  Last Vitals:  Vitals:   05/18/20 1249 05/18/20 1300  BP:  101/68  Pulse: 83 72  Resp: 17 17  Temp:    SpO2: 100% 100%    Last Pain:  Vitals:   05/18/20 1300  TempSrc:   PainSc: 3                  Casara Perrier A.

## 2020-05-22 ENCOUNTER — Encounter (HOSPITAL_BASED_OUTPATIENT_CLINIC_OR_DEPARTMENT_OTHER): Payer: Self-pay | Admitting: General Surgery

## 2020-05-22 LAB — SURGICAL PATHOLOGY

## 2020-06-01 ENCOUNTER — Ambulatory Visit (INDEPENDENT_AMBULATORY_CARE_PROVIDER_SITE_OTHER): Payer: 59 | Admitting: Physician Assistant

## 2020-06-01 ENCOUNTER — Other Ambulatory Visit: Payer: Self-pay

## 2020-06-01 ENCOUNTER — Encounter: Payer: Self-pay | Admitting: Physician Assistant

## 2020-06-01 DIAGNOSIS — L700 Acne vulgaris: Secondary | ICD-10-CM

## 2020-06-01 DIAGNOSIS — Z87898 Personal history of other specified conditions: Secondary | ICD-10-CM

## 2020-06-01 DIAGNOSIS — Z1283 Encounter for screening for malignant neoplasm of skin: Secondary | ICD-10-CM | POA: Diagnosis not present

## 2020-06-01 DIAGNOSIS — L309 Dermatitis, unspecified: Secondary | ICD-10-CM

## 2020-06-01 DIAGNOSIS — Z86018 Personal history of other benign neoplasm: Secondary | ICD-10-CM

## 2020-06-01 MED ORDER — MINOCYCLINE HCL 100 MG PO CAPS: 100.0000 mg | ORAL_CAPSULE | Freq: Every day | ORAL | 6 refills | Status: DC

## 2020-06-01 MED ORDER — WINLEVI 1 % EX CREA: 1.0000 "application " | TOPICAL_CREAM | Freq: Every day | CUTANEOUS | 0 refills | Status: DC

## 2020-06-01 MED ORDER — TRIAMCINOLONE ACETONIDE 0.1 % EX CREA: 1.0000 "application " | TOPICAL_CREAM | Freq: Two times a day (BID) | CUTANEOUS | 3 refills | Status: DC

## 2020-06-02 NOTE — Progress Notes (Signed)
   New Patient   Subjective  Rebecca Pearson is a 40 y.o. female who presents for the following: Annual Exam (NO CONCERNS).   The following portions of the chart were reviewed this encounter and updated as appropriate:  Allergies  Meds  Problems      Objective  Well appearing patient in no apparent distress; mood and affect are within normal limits.  A full examination was performed including scalp, head, eyes, ears, nose, lips, neck, chest, axillae, abdomen, back, buttocks, bilateral upper extremities, bilateral lower extremities, hands, feet, fingers, toes, fingernails, and toenails. All findings within normal limits unless otherwise noted below.  Objective  Chest - Medial [facility]): White scar- clear  Objective  Chest - Medial [facility]): Full body skin examination- no atypical moles or non mole skin cancer  Objective  Right side of chin > face: Erythematous papules and pustules with comedones   Objective  Left Dorsal Hand, Right Dorsal Hand: Thin scaly erythematous papules coalescing to plaques.    Assessment & Plan  History of atypical nevus Chest - Medial (Center)  Yearly skin check  Screening exam for skin cancer Chest - Medial (Center)  Acne vulgaris Right side of chin > face  minocycline (MINOCIN) 100 MG capsule - Right side of chin > face  Clascoterone (WINLEVI) 1 % CREA - Right side of chin > face  Dermatitis (2) Left Dorsal Hand; Right Dorsal Hand  triamcinolone (KENALOG) 0.1 % - Left Dorsal Hand, Right Dorsal Hand     I, Daisha Filosa, PA-C, have reviewed all documentation for this visit. The documentation on 06/02/20 for the exam, diagnosis, procedures, and orders are all accurate and complete.

## 2020-06-20 ENCOUNTER — Telehealth: Payer: Self-pay | Admitting: Physician Assistant

## 2020-06-20 DIAGNOSIS — L700 Acne vulgaris: Secondary | ICD-10-CM

## 2020-06-20 MED ORDER — WINLEVI 1 % EX CREA: 1.0000 "application " | TOPICAL_CREAM | Freq: Every day | CUTANEOUS | 0 refills | Status: DC

## 2020-06-20 NOTE — Telephone Encounter (Signed)
KRS gave her sample of Winlevi and she wants Rx for it called in to [facility] in [place]

## 2020-06-20 NOTE — Telephone Encounter (Signed)
Sent prescription to pharmacy and informed patient she must take the promo card in order for medication to be affordable

## 2020-06-29 ENCOUNTER — Telehealth: Payer: Self-pay | Admitting: Physician Assistant

## 2020-06-29 ENCOUNTER — Telehealth: Payer: Self-pay | Admitting: *Deleted

## 2020-06-29 NOTE — Telephone Encounter (Signed)
Ellis Parents was supposed to call in Rx for Albert Lea for her, but [facility] in [place] told her they never got it. (she had been given a sample)

## 2020-06-29 NOTE — Telephone Encounter (Signed)
Rebecca Pearson requires prior authorization- done via cover my meds   [name] (Key: [id number])  Your information has been sent to [facility].

## 2020-07-03 NOTE — Telephone Encounter (Signed)
Phone call to patient to inform her that the prior authorization for the [facility] was denied.  I asked patient if she had given the [facility] card to her Pharmacy?  Patient states that she didn't give the card to her Pharmacy.  I informed patient that she would need to give the card to her Pharmacy to get the medication.  Patient aware.

## 2020-07-03 NOTE — Telephone Encounter (Signed)
Prior authorization for Winlevi 1% Cream denied.   This request has received an Unfavorable outcome.  Please note any additional information provided by [facility] at the bottom of your screen.  You will also receive a faxed copy of the determination.

## 2020-07-05 ENCOUNTER — Telehealth: Payer: Self-pay | Admitting: Physician Assistant

## 2020-07-05 DIAGNOSIS — L700 Acne vulgaris: Secondary | ICD-10-CM

## 2020-07-05 MED ORDER — WINLEVI 1 % EX CREA: 1.0000 "application " | TOPICAL_CREAM | Freq: Every day | CUTANEOUS | 3 refills | Status: DC

## 2020-07-05 NOTE — Telephone Encounter (Signed)
Says pharmacy ([facility], [place]) never got the new RX for [employer].

## 2020-12-07 ENCOUNTER — Other Ambulatory Visit: Payer: Self-pay | Admitting: Physician Assistant

## 2020-12-07 DIAGNOSIS — N898 Other specified noninflammatory disorders of vagina: Secondary | ICD-10-CM | POA: Insufficient documentation

## 2020-12-07 DIAGNOSIS — L700 Acne vulgaris: Secondary | ICD-10-CM

## 2021-05-16 LAB — HM PAP SMEAR

## 2021-05-22 DIAGNOSIS — N945 Secondary dysmenorrhea: Secondary | ICD-10-CM | POA: Insufficient documentation

## 2021-05-28 ENCOUNTER — Encounter: Payer: 59 | Attending: Family Medicine | Admitting: Skilled Nursing Facility1

## 2021-05-28 ENCOUNTER — Other Ambulatory Visit: Payer: Self-pay

## 2021-05-28 DIAGNOSIS — K580 Irritable bowel syndrome with diarrhea: Secondary | ICD-10-CM | POA: Diagnosis present

## 2021-05-28 NOTE — Progress Notes (Addendum)
Medical Nutrition Therapy  Appointment Start time:  4:45  Appointment End time:  6:20  Primary concerns today: GERD, IBS-D, glout, extremely painful GI flares Referral diagnosis: k58.9 Preferred learning style: auditory, visual Learning readiness: change in progress   NUTRITION ASSESSMENT    Clinical Medical Hx: GERD, IBS-D Medications: see list Labs: WNL Notable Signs/Symptoms: Bloat, gas, diarrhea, abdominal pain, vomiting   Lifestyle & Dietary Hx  Pt states she has gained weight in the last 2 years and Optivaia made a tremendous difference with no flare ups while she was doing this diet; pt states she cannot continue this diet though due to it being gross and expensive and as soon as she stopped it her flares started again: 1 flare being: upper abdominal pain not being able to move radiating across abdomin and includes chest pain (possible esophageal spasm): can happen on its own:   Flare 2: diarrhea 7+ times a time mucus and watery stool and severe intestinal cramping  Pt states she has a sensation to have to use the bathroom often.   Pt states she has never had blood in her stool aside from hemorriod.  Pt states she has been tested for all GI issues under the sun and went to GI specialist. Pt has had a cholecystectomy and has been tested for celiac.  Pt states fast food is a definite intestinal cramping cause so avoids it completely.   Pt states she has tried low FODMAP diet and does have a painful reaction to bread and beer.   Garlic and onion is a no go.   Pt states she could eat greens every night without issue and did when she was doing the optacvia diet.   Reactions to: Bread Beer Pasta   Pt states she could feel the bloat immidiatly after eating cheese: dietitian advised to try lactaid enzyme since she already has some at home and see if it helps  Pt states she has never tried Creon.   Dietitian read some labels available from the optavia diet, seems these  products have broken down forms of proteins; isolates so this dietitian wonders if her main issue is enzymatic action limitations which could be why she did so well with optavia; though unsure if wheat, onion, or garlic are in these products.  Pt states she does not have issues with the fruits she eats.  Estimated Energy Needs Calories: 2000   NUTRITION DIAGNOSIS  [place]-1.4 Altered GI function As related to possible disfunction in enzymatic action.  As evidenced by Severe GI symptoms: GERD, bloating, diarrhea, vomiting and .tolerance to isolates   NUTRITION INTERVENTION  Nutrition education (E-1) on the following topics:  The complexity of the GI tract  Typical flaring foods Pancreatic enzymatic actions Label reading for offenders such as wheat, onion, garlic  Handouts Provided Include  Listed out "safe" foods with pt to eat confidently within the context of balanced meals  Learning Style & Readiness for Change Teaching method utilized: Visual & Auditory  Demonstrated degree of understanding via: Teach Back  Barriers to learning/adherence to lifestyle change: stress/GI symptoms   Goals Established by Pt Focus on health and possibility of deficiencies and work on letting diet mentality and weight thoughts go Do not eat takis if you want a salty snack try salty peanuts or regular potato chips Keep a very detailed journal of really bad flares up to 2 days prior to the flare  Avoid wheat; look for on the label  Avoid onion and garlic completely; look  for on the label Try bobs red mill bars and flours Try buckwheat Ask your doctor if they think Creon is appropriate for you and if you have insurance coverage  Try Now protein isolate powder to add to smoothies    MONITORING & EVALUATION Dietary intake, weekly physical activity  Next Steps  Patient is to return in 6 weeks

## 2021-07-03 ENCOUNTER — Encounter (HOSPITAL_COMMUNITY): Payer: Self-pay

## 2021-07-19 ENCOUNTER — Other Ambulatory Visit: Payer: Self-pay | Admitting: Physician Assistant

## 2021-09-12 ENCOUNTER — Ambulatory Visit: Payer: 59 | Attending: Orthopedic Surgery | Admitting: Physical Therapy

## 2021-09-12 ENCOUNTER — Encounter: Payer: Self-pay | Admitting: Physical Therapy

## 2021-09-12 DIAGNOSIS — M79651 Pain in right thigh: Secondary | ICD-10-CM | POA: Diagnosis present

## 2021-09-12 DIAGNOSIS — M62838 Other muscle spasm: Secondary | ICD-10-CM | POA: Insufficient documentation

## 2021-09-12 DIAGNOSIS — M6281 Muscle weakness (generalized): Secondary | ICD-10-CM | POA: Diagnosis present

## 2021-09-12 NOTE — Therapy (Signed)
[facility] ?[address] ?[place], [place], [address] ?Phone: [phone]   Fax:  [phone] ? ?Physical Therapy Evaluation ? ?Patient Details  ?Name: Rebecca Pearson ?MRN: [id number] ?Date of Birth: 1980/12/03 ?Referring Provider (PT): Dorna Leitz MD ? ? ?Encounter Date: 09/12/2021 ? ? PT End of Session - 09/12/21 1017   ? ? Visit Number 1   ? Number of Visits 12   ? Date for PT Re-Evaluation 10/24/21   ? Authorization Type FOTO.   ? PT Start Time 0902   ? PT Stop Time [phone]   ? PT Time Calculation (min) 49 min   ? Activity Tolerance Patient tolerated treatment well   ? Behavior During Therapy [facility] for tasks assessed/performed   ? ?  ?  ? ?  ? ? ?Past Medical History:  ?Diagnosis Date  ? Atypical nevus 07/31/2012  ? Right Submammary  ? Biliary colic 12/1446  ? Gastroesophageal reflux disease   ? History of colitis   ? History of migraine headaches   ? History of palpitations   ? no current med.  ? History of thrombocytopenia   ? only during pregnancy  ? PONV (postoperative nausea and vomiting)   ? ? ?Past Surgical History:  ?Procedure Laterality Date  ? CHOLECYSTECTOMY N/A 02/10/2017  ? Procedure: LAPAROSCOPIC CHOLECYSTECTOMY;  Surgeon: Rolm Bookbinder, MD;  Location: [facility];  Service: General;  Laterality: N/A;  ? RADIOACTIVE SEED GUIDED EXCISIONAL BREAST BIOPSY Left 05/18/2020  ? Procedure: LEFT RADIOACTIVE SEED GUIDED EXCISIONAL BREAST BIOPSY;  Surgeon: Rolm Bookbinder, MD;  Location: [facility];  Service: General;  Laterality: Left;  ? SHOULDER ARTHROSCOPY WITH BICEPSTENOTOMY Right 01/30/2015  ? Procedure: SHOULDER ARTHROSCOPY WITH BICEPS TENOTOMY, OPEN TENODESIS,LABRAL DEBRIDEMENT;  Surgeon: Tania Ade, MD;  Location: [facility];  Service: Orthopedics;  Laterality: Right;  Rigth shoulder diagnostic arthroscopy biceps tenotomy, open tenodesis, labral debridement  ? TONSILLECTOMY  2002  ? ? ?There were no vitals  filed for this visit. ? ? ? Subjective Assessment - 09/12/21 1003   ? ? Subjective The patient presents to the clinic today with c/o right quadriceps strain.  She states this was the result of doing a backward lunge about two weeks ago.  With movement the pain was excuriating and resulted in an Urgent Care visit.  She had an Orthopedist visit and was in an [facility] for a week.  Her pain today at rest is a 2/10 described as a dull ache.  Her thigh feels weak to her and she lacks confidence in it currently.  Ice and rest decrease pain and increased activity increase pain.   ? Pertinent History Shoulder surgery x 2.   ? How long can you walk comfortably? Slow paced walking is fine.   ? Patient Stated Goals Get back to working out.   ? Currently in Pain? Yes   ? Pain Score 2    ? Pain Orientation Right   ? Pain Descriptors / Indicators Aching;Dull   ? Pain Type Acute pain   ? Pain Onset 1 to 4 weeks ago   ? Aggravating Factors  See above.   ? Pain Relieving Factors See above.   ? ?  ?  ? ?  ? ? ? ? ? [facility] PT Assessment - 09/12/21 0001   ? ?  ? Assessment  ? Medical Diagnosis Right quad strain.   ? Referring Provider (PT) Dorna Leitz MD   ? Onset Date/Surgical Date --   ~  2 weeks ago.  ?  ? Precautions  ? Precaution Comments Pain-free ther ex.   ?  ? Restrictions  ? Weight Bearing Restrictions No   ?  ? Balance Screen  ? Has the patient fallen in the past 6 months No   ? Has the patient had a decrease in activity level because of a fear of falling?  No   ? Is the patient reluctant to leave their home because of a fear of falling?  No   ?  ? Home Environment  ? Living Environment Private residence   ?  ? Prior Function  ? Level of Independence Independent   ?  ? Observation/Other Assessments  ? Focus on Therapeutic Outcomes (FOTO)  Complete.   ?  ? ROM / Strength  ? AROM / PROM / Strength AROM;Strength   ?  ? AROM  ? Overall AROM Comments Full right knee range of motion.   ?  ? Strength  ? Overall Strength Comments Right  knee extension performed a bit tentatively and shaky.   ?  ? Palpation  ? Palpation comment CC is tenderness over right lateral quads (mid-thigh).   ?  ? Ambulation/Gait  ? Gait Comments WNL.   ? ?  ?  ? ?  ? ? ? ? ? ? ? ? ? ? ? ? ? ?Objective measurements completed on examination: See above findings.  ? ? ? ? ? [facility] Adult PT Treatment/Exercise - 09/12/21 0001   ? ?  ? Modalities  ? Modalities Electrical Stimulation;Vasopneumatic   ?  ? Electrical Stimulation  ? Electrical Stimulation Location Right lateral thigh   ? Electrical Stimulation Action IFC at 80-150 Hz.   ? Electrical Stimulation Parameters 40% scan x 20 minutes.   ?  ? Vasopneumatic  ? Number Minutes Vasopneumatic  20 minutes   ? Vasopnuematic Location  --   Right thigh.  ? Vasopneumatic Pressure Low   ? ?  ?  ? ?  ? ? ? ? ? ? ? ? ? ? ? ? ? ? ? PT Long Term Goals - 09/12/21 1023   ? ?  ? PT LONG TERM GOAL #1  ? Title Independent with a HEP.   ? Time 6   ? Period Weeks   ? Status New   ?  ? PT LONG TERM GOAL #2  ? Title Resume normal workouts without pain.   ? Time 6   ? Period Weeks   ? Status New   ?  ? PT LONG TERM GOAL #3  ? Title Demonstrate a right quad strength grade of 5/5.   ? Time 6   ? Period Weeks   ? Status New   ? ?  ?  ? ?  ? ? ? ? ? ? ? ? ? Plan - 09/12/21 1018   ? ? Clinical Impression Statement The patient presents to O[facility]T wiht c/o right quadriceps pain due to an injury sustained while working out about two weeks ago. She has full range of motion but strength decreased likely due to pain.  She is palpably tender over her right lateral quads (mid-thigh).  She is walking slowly but with a normal gait cycle.  Patient will benefit from skilled physical therapy intervention to address pain and deficits.   ? Personal Factors and Comorbidities Other   ? Examination-Activity Limitations Locomotion Level   ? Examination-Participation Restrictions Other   ? Stability/Clinical Decision Making Stable/Uncomplicated   ? Clinical Decision  Making Low    ? Rehab Potential Excellent   ? PT Frequency 2x / week   ? PT Duration 6 weeks   ? PT Treatment/Interventions ADLs/Self Care Home Management;Cryotherapy;Electrical Stimulation;Ultrasound;Moist Heat;Functional mobility training;Therapeutic activities;Therapeutic exercise;Neuromuscular re-education;Manual techniques;Passive range of motion;Vasopneumatic Device   ? PT Next Visit Plan Pulsed combo e'stim/US, STW/M, vasopneumatic and electrical stimulation, begin with low-level recumbent bike, pain-free right quad strengthening.   ? Consulted and Agree with Plan of Care Patient   ? ?  ?  ? ?  ? ? ?Patient will benefit from skilled therapeutic intervention in order to improve the following deficits and impairments:  Pain, Increased muscle spasms, Decreased strength, Decreased activity tolerance ? ?Visit Diagnosis: ?Pain in right thigh - Plan: PT plan of care cert/re-cert ? ?Other muscle spasm - Plan: PT plan of care cert/re-cert ? ?Muscle weakness (generalized) - Plan: PT plan of care cert/re-cert ? ? ? ? ?Problem List ?There are no problems to display for this patient. ? ? ?Ashar Lewinski, [name], PT ?09/12/2021, 10:26 AM ? ?[facility] ?[address] ?[place], [place], [address] ?Phone: [phone]   Fax:  [phone] ? ?Name: Rebecca Pearson ?MRN: [id number] ?Date of Birth: 1981/03/12 ? ? ?

## 2021-09-14 ENCOUNTER — Ambulatory Visit: Payer: 59

## 2021-09-18 ENCOUNTER — Ambulatory Visit: Payer: 59 | Attending: Orthopedic Surgery | Admitting: Physical Therapy

## 2021-09-18 DIAGNOSIS — M79651 Pain in right thigh: Secondary | ICD-10-CM | POA: Diagnosis present

## 2021-09-18 DIAGNOSIS — M62838 Other muscle spasm: Secondary | ICD-10-CM | POA: Diagnosis present

## 2021-09-18 DIAGNOSIS — M6281 Muscle weakness (generalized): Secondary | ICD-10-CM | POA: Diagnosis present

## 2021-09-18 NOTE — Therapy (Signed)
[place] ?[facility] ?[address] ?[place], [place], [address] ?Phone: [phone]   Fax:  [phone] ? ?Physical Therapy Treatment ? ?Patient Details  ?Name: Rebecca Pearson ?MRN: [id number] ?Date of Birth: 1981/03/13 ?Referring Provider (PT): Dorna Leitz MD ? ? ?Encounter Date: 09/18/2021 ? ? PT End of Session - 09/18/21 1111   ? ? Visit Number 2   ? Number of Visits 12   ? Date for PT Re-Evaluation 10/24/21   ? Authorization Type FOTO.   ? PT Start Time 1032   ? PT Stop Time 8546   ? PT Time Calculation (min) 54 min   ? Activity Tolerance Patient tolerated treatment well   ? Behavior During Therapy [facility] for tasks assessed/performed   ? ?  ?  ? ?  ? ? ?Past Medical History:  ?Diagnosis Date  ? Atypical nevus 07/31/2012  ? Right Submammary  ? Biliary colic 27/0350  ? Gastroesophageal reflux disease   ? History of colitis   ? History of migraine headaches   ? History of palpitations   ? no current med.  ? History of thrombocytopenia   ? only during pregnancy  ? PONV (postoperative nausea and vomiting)   ? ? ?Past Surgical History:  ?Procedure Laterality Date  ? CHOLECYSTECTOMY N/A 02/10/2017  ? Procedure: LAPAROSCOPIC CHOLECYSTECTOMY;  Surgeon: Rolm Bookbinder, MD;  Location: [place];  Service: General;  Laterality: N/A;  ? RADIOACTIVE SEED GUIDED EXCISIONAL BREAST BIOPSY Left 05/18/2020  ? Procedure: LEFT RADIOACTIVE SEED GUIDED EXCISIONAL BREAST BIOPSY;  Surgeon: Rolm Bookbinder, MD;  Location: [facility];  Service: General;  Laterality: Left;  ? SHOULDER ARTHROSCOPY WITH BICEPSTENOTOMY Right 01/30/2015  ? Procedure: SHOULDER ARTHROSCOPY WITH BICEPS TENOTOMY, OPEN TENODESIS,LABRAL DEBRIDEMENT;  Surgeon: Tania Ade, MD;  Location: [facility];  Service: Orthopedics;  Laterality: Right;  Rigth shoulder diagnostic arthroscopy biceps tenotomy, open tenodesis, labral debridement  ? TONSILLECTOMY  2002  ? ? ?There were no vitals  filed for this visit. ? ? Subjective Assessment - 09/18/21 1112   ? ? Subjective Sore.   ? Pertinent History Shoulder surgery x 2.   ? How long can you walk comfortably? Slow paced walking is fine.   ? Patient Stated Goals Get back to working out.   ? Currently in Pain? Yes   ? Pain Score 3    ? Pain Location --   Thigh.  ? Pain Descriptors / Indicators Sore   ? Pain Type Acute pain   ? Pain Onset 1 to 4 weeks ago   ? ?  ?  ? ?  ? ? ? ? ? ? ? ? ? ? ? ? ? ? ? ? ? ? ? ? [facility] Adult PT Treatment/Exercise - 09/18/21 0001   ? ?  ? Modalities  ? Modalities Electrical Stimulation;Ultrasound;Vasopneumatic   ?  ? Electrical Stimulation  ? Electrical Stimulation Location RT lateral thigh.   ? Electrical Stimulation Action IFC at 80-150 Hz.   ? Electrical Stimulation Parameters 40% scan x 20 minutes.   ? Electrical Stimulation Goals Pain;Tone   ?  ? Ultrasound  ? Ultrasound Location RT lateral thigh.   ? Ultrasound Parameters Large soundhead combo e'stim/US at 1.50 W/CM2 x 12 minutes at 50%, 3.3 Mhz.   ? Ultrasound Goals Pain   ?  ? Vasopneumatic  ? Number Minutes Vasopneumatic  20 minutes   ? Vasopnuematic Location  --   Right thigh.  ? Vasopneumatic Pressure Low   ?  ?  Manual Therapy  ? Manual Therapy Soft tissue mobilization   ? Soft tissue mobilization STW/M x 11 minutes to patient's right affected thigh musculature including ischemic release technique to reduce tone and pain.   ? ?  ?  ? ?  ? ? ? ? ? ? ? ? ? ? ? ? ? ? ? PT Long Term Goals - 09/12/21 1023   ? ?  ? PT LONG TERM GOAL #1  ? Title Independent with a HEP.   ? Time 6   ? Period Weeks   ? Status New   ?  ? PT LONG TERM GOAL #2  ? Title Resume normal workouts without pain.   ? Time 6   ? Period Weeks   ? Status New   ?  ? PT LONG TERM GOAL #3  ? Title Demonstrate a right quad strength grade of 5/5.   ? Time 6   ? Period Weeks   ? Status New   ? ?  ?  ? ?  ? ? ? ? ? ? ? ? Plan - 09/18/21 1115   ? ? Clinical Impression Statement Patient sore upone presentation to the  clinic today.  Palpable trigger point in right upper/lateral quad region.  Patient tolerated tretament well.   ? Personal Factors and Comorbidities Other   ? Examination-Activity Limitations Locomotion Level   ? Examination-Participation Restrictions Other   ? Stability/Clinical Decision Making Stable/Uncomplicated   ? Rehab Potential Excellent   ? PT Frequency 2x / week   ? PT Duration 6 weeks   ? PT Treatment/Interventions ADLs/Self Care Home Management;Cryotherapy;Electrical Stimulation;Ultrasound;Moist Heat;Functional mobility training;Therapeutic activities;Therapeutic exercise;Neuromuscular re-education;Manual techniques;Passive range of motion;Vasopneumatic Device   ? PT Next Visit Plan Pulsed combo e'stim/US, STW/M, vasopneumatic and electrical stimulation, begin with low-level recumbent bike, pain-free right quad strengthening.   ? Consulted and Agree with Plan of Care Patient   ? ?  ?  ? ?  ? ? ?Patient will benefit from skilled therapeutic intervention in order to improve the following deficits and impairments:  Pain, Increased muscle spasms, Decreased strength, Decreased activity tolerance ? ?Visit Diagnosis: ?Pain in right thigh ? ?Muscle weakness (generalized) ? ?Other muscle spasm ? ? ? ? ?Problem List ?There are no problems to display for this patient. ? ? ?Derrick Tiegs, [name], PT ?09/18/2021, 11:54 AM ? ?[place] ?[facility] ?[address] ?[place], [place], [address] ?Phone: [phone]   Fax:  [phone] ? ?Name: CING Bagley ?MRN: [id number] ?Date of Birth: 08/01/1980 ? ? ? ?

## 2021-09-20 ENCOUNTER — Ambulatory Visit: Payer: 59 | Admitting: *Deleted

## 2021-09-20 DIAGNOSIS — M62838 Other muscle spasm: Secondary | ICD-10-CM

## 2021-09-20 DIAGNOSIS — M79651 Pain in right thigh: Secondary | ICD-10-CM | POA: Diagnosis not present

## 2021-09-20 DIAGNOSIS — M6281 Muscle weakness (generalized): Secondary | ICD-10-CM

## 2021-09-20 NOTE — Therapy (Signed)
[place] ?[facility] ?[place] ?[place], [place], [address] ?Phone: [phone]   Fax:  [phone] ? ?Physical Therapy Treatment ? ?Patient Details  ?Name: Rebecca Pearson ?MRN: [id number] ?Date of Birth: 1981-02-14 ?Referring Provider (PT): Dorna Leitz MD ? ? ?Encounter Date: 09/20/2021 ? ? PT End of Session - 09/20/21 1138   ? ? Visit Number 3   ? Number of Visits 12   ? Date for PT Re-Evaluation 10/24/21   ? Authorization Type FOTO.   ? PT Start Time 1030   ? PT Stop Time 1128   ? PT Time Calculation (min) 58 min   ? ?  ?  ? ?  ? ? ?Past Medical History:  ?Diagnosis Date  ? Atypical nevus 07/31/2012  ? Right Submammary  ? Biliary colic 05/6008  ? Gastroesophageal reflux disease   ? History of colitis   ? History of migraine headaches   ? History of palpitations   ? no current med.  ? History of thrombocytopenia   ? only during pregnancy  ? PONV (postoperative nausea and vomiting)   ? ? ?Past Surgical History:  ?Procedure Laterality Date  ? CHOLECYSTECTOMY N/A 02/10/2017  ? Procedure: LAPAROSCOPIC CHOLECYSTECTOMY;  Surgeon: Rolm Bookbinder, MD;  Location: [facility];  Service: General;  Laterality: N/A;  ? RADIOACTIVE SEED GUIDED EXCISIONAL BREAST BIOPSY Left 05/18/2020  ? Procedure: LEFT RADIOACTIVE SEED GUIDED EXCISIONAL BREAST BIOPSY;  Surgeon: Rolm Bookbinder, MD;  Location: [facility];  Service: General;  Laterality: Left;  ? SHOULDER ARTHROSCOPY WITH BICEPSTENOTOMY Right 01/30/2015  ? Procedure: SHOULDER ARTHROSCOPY WITH BICEPS TENOTOMY, OPEN TENODESIS,LABRAL DEBRIDEMENT;  Surgeon: Tania Ade, MD;  Location: [facility];  Service: Orthopedics;  Laterality: Right;  Rigth shoulder diagnostic arthroscopy biceps tenotomy, open tenodesis, labral debridement  ? TONSILLECTOMY  2002  ? ? ?There were no vitals filed for this visit. ? ? Subjective Assessment - 09/20/21 1038   ? ? Subjective RT quad 2/10   ? Pertinent History  Shoulder surgery x 2.   ? How long can you walk comfortably? Slow paced walking is fine.   ? Patient Stated Goals Get back to working out.   ? Currently in Pain? Yes   ? Pain Score 2    ? Pain Location Leg   ? ?  ?  ? ?  ? ? ? ? ? ? ? ? ? ? ? ? ? ? ? ? ? ? ? ? [facility] Adult PT Treatment/Exercise - 09/20/21 0001   ? ?  ? Exercises  ? Exercises Knee/Hip   ?  ? Knee/Hip Exercises: Standing  ? Heel Raises Both;2 sets   2x fatigue  ? Heel Raises Limitations Toe raises 2xfatigue   ? Other Standing Knee Exercises Standing HS curl for active quad stretching x 10 (monitor HS cramping)   ? Other Standing Knee Exercises Discussed and demonstrated LAQs and supine HS curl for HEP   ?  ? Modalities  ? Modalities Electrical Stimulation;Ultrasound;Vasopneumatic   ?  ? Electrical Stimulation  ? Electrical Stimulation Location RT lateral thigh.   ? Electrical Stimulation Action IFC 80-'150hz'$    ? Electrical Stimulation Parameters x15 mins   ? Electrical Stimulation Goals Pain;Tone   ?  ? Vasopneumatic  ? Number Minutes Vasopneumatic  15 minutes   ? Vasopnuematic Location  --   Right thigh.  ? Vasopneumatic Pressure Low   ? Vasopneumatic Temperature  34   ?  ? Manual Therapy  ?  Manual Therapy Soft tissue mobilization   ? Soft tissue mobilization IASTW/M x 6 minutes to patient's right affected thigh musculature including ischemic release technique to reduce tone and pain.   ? ?  ?  ? ?  ? ? ? ? ? ? ? ? ? ? ? ? ? ? ? PT Long Term Goals - 09/12/21 1023   ? ?  ? PT LONG TERM GOAL #1  ? Title Independent with a HEP.   ? Time 6   ? Period Weeks   ? Status New   ?  ? PT LONG TERM GOAL #2  ? Title Resume normal workouts without pain.   ? Time 6   ? Period Weeks   ? Status New   ?  ? PT LONG TERM GOAL #3  ? Title Demonstrate a right quad strength grade of 5/5.   ? Time 6   ? Period Weeks   ? Status New   ? ?  ?  ? ?  ? ? ? ? ? ? ? ? Plan - 09/20/21 1417   ? ? Clinical Impression Statement Pt arrived today doing fairly well with RT quad. and reports  tightness and soreness. Rx focused ROM, quad activation and some active stretching. She was able to perform 10 mins on the bike with some increased soreness 3/10, but did ok.Active qaud stretch performed with standing HS curl, but Pt reports of having HS cramping in RT side lately. Estim and vaso performed end of session with decreased pain reported.   ? Personal Factors and Comorbidities Other   ? Examination-Activity Limitations Locomotion Level   ? Examination-Participation Restrictions Other   ? Stability/Clinical Decision Making Stable/Uncomplicated   ? Rehab Potential Excellent   ? PT Frequency 2x / week   ? PT Duration 6 weeks   ? PT Treatment/Interventions ADLs/Self Care Home Management;Cryotherapy;Electrical Stimulation;Ultrasound;Moist Heat;Functional mobility training;Therapeutic activities;Therapeutic exercise;Neuromuscular re-education;Manual techniques;Passive range of motion;Vasopneumatic Device   ? PT Next Visit Plan Pulsed combo e'stim/US, STW/M, vasopneumatic and electrical stimulation, begin with low-level recumbent bike, pain-free right quad strengthening.   ? Consulted and Agree with Plan of Care Patient   ? ?  ?  ? ?  ? ? ?Patient will benefit from skilled therapeutic intervention in order to improve the following deficits and impairments:  Pain, Increased muscle spasms, Decreased strength, Decreased activity tolerance ? ?Visit Diagnosis: ?Pain in right thigh ? ?Muscle weakness (generalized) ? ?Other muscle spasm ? ? ? ? ?Problem List ?There are no problems to display for this patient. ? ? ?Ameenah Prosser,CHRIS, PTA ?09/20/2021, 2:26 PM ? ?[place] ?[facility] ?[place] ?[place], [place], [address] ?Phone: [phone]   Fax:  [phone] ? ?Name: Rebecca Pearson ?MRN: [id number] ?Date of Birth: July 13, 1980 ? ? ? ?

## 2021-09-24 ENCOUNTER — Ambulatory Visit: Payer: 59 | Admitting: Physical Therapy

## 2021-09-24 DIAGNOSIS — M6281 Muscle weakness (generalized): Secondary | ICD-10-CM

## 2021-09-24 DIAGNOSIS — M79651 Pain in right thigh: Secondary | ICD-10-CM | POA: Diagnosis not present

## 2021-09-24 DIAGNOSIS — M62838 Other muscle spasm: Secondary | ICD-10-CM

## 2021-09-24 NOTE — Therapy (Signed)
[place] ?[facility] ?[place] ?[place], [place], [address] ?Phone: [phone]   Fax:  [phone] ? ?Physical Therapy Treatment ? ?Patient Details  ?Name: Rebecca Pearson ?MRN: [id number] ?Date of Birth: [birth year] ?Referring Provider (PT): Dorna Leitz MD ? ? ?Encounter Date: 09/24/2021 ? ? PT End of Session - 09/24/21 1100   ? ? Visit Number 4   ? Number of Visits 12   ? Authorization Type FOTO.   ? PT Start Time [phone]   ? PT Stop Time 1950   ? PT Time Calculation (min) 51 min   ? Activity Tolerance Patient tolerated treatment well   ? Behavior During Therapy [facility] for tasks assessed/performed   ? ?  ?  ? ?  ? ? ?Past Medical History:  ?Diagnosis Date  ? Atypical nevus 07/31/2012  ? Right Submammary  ? Biliary colic 93/2671  ? Gastroesophageal reflux disease   ? History of colitis   ? History of migraine headaches   ? History of palpitations   ? no current med.  ? History of thrombocytopenia   ? only during pregnancy  ? PONV (postoperative nausea and vomiting)   ? ? ?Past Surgical History:  ?Procedure Laterality Date  ? CHOLECYSTECTOMY N/A 02/10/2017  ? Procedure: LAPAROSCOPIC CHOLECYSTECTOMY;  Surgeon: Rolm Bookbinder, MD;  Location: [facility];  Service: General;  Laterality: N/A;  ? RADIOACTIVE SEED GUIDED EXCISIONAL BREAST BIOPSY Left 05/18/2020  ? Procedure: LEFT RADIOACTIVE SEED GUIDED EXCISIONAL BREAST BIOPSY;  Surgeon: Rolm Bookbinder, MD;  Location: [facility];  Service: General;  Laterality: Left;  ? SHOULDER ARTHROSCOPY WITH BICEPSTENOTOMY Right 01/30/2015  ? Procedure: SHOULDER ARTHROSCOPY WITH BICEPS TENOTOMY, OPEN TENODESIS,LABRAL DEBRIDEMENT;  Surgeon: Tania Ade, MD;  Location: [facility];  Service: Orthopedics;  Laterality: Right;  Rigth shoulder diagnostic arthroscopy biceps tenotomy, open tenodesis, labral debridement  ? TONSILLECTOMY  2002  ? ? ?There were no vitals filed for this visit. ? ? Subjective  Assessment - 09/24/21 1101   ? ? Subjective Did some gardening.  Quad is sore.   ? Pertinent History Shoulder surgery x 2.   ? How long can you walk comfortably? Slow paced walking is fine.   ? Patient Stated Goals Get back to working out.   ? Currently in Pain? Yes   ? Pain Score 3    ? Pain Location --   Thigh.  ? Pain Orientation Right   ? Pain Descriptors / Indicators Sore   ? Pain Type Acute pain   ? Pain Onset 1 to 4 weeks ago   ? ?  ?  ? ?  ? ? ? ? ? ? ? ? ? ? ? ? ? ? ? ? ? ? ? ? [facility] Adult PT Treatment/Exercise - 09/24/21 0001   ? ?  ? Exercises  ? Exercises Knee/Hip   ?  ? Knee/Hip Exercises: Aerobic  ? Recumbent Bike Level 3 x 10 minutes.   ?  ? Knee/Hip Exercises: Machines for Strengthening  ? Cybex Knee Extension 10# bilateral knee extension x 3 minutes (slow reps).   ?  ? Modalities  ? Modalities Electrical Stimulation;Vasopneumatic   ?  ? Electrical Stimulation  ? Electrical Stimulation Location RT lateral quad   ? Electrical Stimulation Action IFC at 80-150 Hz.   ? Electrical Stimulation Parameters 40% scan x 20 minutes.   ?  ? Vasopneumatic  ? Number Minutes Vasopneumatic  20 minutes   ? Vasopnuematic Location  --  RT thigh.,  ? Vasopneumatic Pressure Low   ?  ? Manual Therapy  ? Manual Therapy Soft tissue mobilization   ? Soft tissue mobilization STW/M x 10 minutes to patient's affected right lateral quad including STW/M and ischemic release technique.   ? ?  ?  ? ?  ? ? ? ? ? ? ? ? ? ? ? ? ? ? ? PT Long Term Goals - 09/12/21 1023   ? ?  ? PT LONG TERM GOAL #1  ? Title Independent with a HEP.   ? Time 6   ? Period Weeks   ? Status New   ?  ? PT LONG TERM GOAL #2  ? Title Resume normal workouts without pain.   ? Time 6   ? Period Weeks   ? Status New   ?  ? PT LONG TERM GOAL #3  ? Title Demonstrate a right quad strength grade of 5/5.   ? Time 6   ? Period Weeks   ? Status New   ? ?  ?  ? ?  ? ? ? ? ? ? ? ? Plan - 09/24/21 1142   ? ? Clinical Impression Statement The patient did great today with  resisted quad exercise.  Patient will be out of town.  Going to do sit to stands from chair and instructed for them to be pain-free.   ? Personal Factors and Comorbidities Other   ? Examination-Activity Limitations Locomotion Level   ? Examination-Participation Restrictions Other   ? ?  ?  ? ?  ? ? ?Patient will benefit from skilled therapeutic intervention in order to improve the following deficits and impairments:    ? ?Visit Diagnosis: ?Pain in right thigh ? ?Muscle weakness (generalized) ? ?Other muscle spasm ? ? ? ? ?Problem List ?There are no problems to display for this patient. ? ? ?Ravinder Lukehart, [name], PT ?09/24/2021, 11:47 AM ? ?[place] ?[facility] ?[address] ?[place], [place], [address] ?Phone: [phone]   Fax:  [phone] ? ?Name: Rebecca Pearson ?MRN: [id number] ?Date of Birth: [birth year] ? ? ? ?

## 2021-09-27 ENCOUNTER — Other Ambulatory Visit: Payer: Self-pay | Admitting: Physician Assistant

## 2021-09-27 ENCOUNTER — Encounter: Payer: 59 | Admitting: *Deleted

## 2021-10-09 ENCOUNTER — Ambulatory Visit: Payer: 59 | Admitting: *Deleted

## 2021-10-09 DIAGNOSIS — M6281 Muscle weakness (generalized): Secondary | ICD-10-CM

## 2021-10-09 DIAGNOSIS — M79651 Pain in right thigh: Secondary | ICD-10-CM | POA: Diagnosis not present

## 2021-10-09 DIAGNOSIS — M62838 Other muscle spasm: Secondary | ICD-10-CM

## 2021-10-09 NOTE — Therapy (Signed)
[facility] [place], [place], [address] Phone: [phone]   Fax:  [phone]  Physical Therapy Treatment  Patient Details  Name: Rebecca Pearson MRN: [id number] Date of Birth: [birth year] Referring Provider (PT): Dorna Leitz MD   Encounter Date: 10/09/2021   PT End of Session - 10/09/21 1513     Visit Number 5    Number of Visits 12    Date for PT Re-Evaluation 10/24/21    Authorization Type FOTO.    PT Start Time 1030    PT Stop Time 1120    PT Time Calculation (min) 50 min             Past Medical History:  Diagnosis Date   Atypical nevus 07/31/2012   Right Submammary   Biliary colic 69/6295   Gastroesophageal reflux disease    History of colitis    History of migraine headaches    History of palpitations    no current med.   History of thrombocytopenia    only during pregnancy   PONV (postoperative nausea and vomiting)     Past Surgical History:  Procedure Laterality Date   CHOLECYSTECTOMY N/A 02/10/2017   Procedure: LAPAROSCOPIC CHOLECYSTECTOMY;  Surgeon: Rolm Bookbinder, MD;  Location: [facility];  Service: General;  Laterality: N/A;   RADIOACTIVE SEED GUIDED EXCISIONAL BREAST BIOPSY Left 05/18/2020   Procedure: LEFT RADIOACTIVE SEED GUIDED EXCISIONAL BREAST BIOPSY;  Surgeon: Rolm Bookbinder, MD;  Location: [facility];  Service: General;  Laterality: Left;   SHOULDER ARTHROSCOPY WITH BICEPSTENOTOMY Right 01/30/2015   Procedure: SHOULDER ARTHROSCOPY WITH BICEPS TENOTOMY, OPEN TENODESIS,LABRAL DEBRIDEMENT;  Surgeon: Tania Ade, MD;  Location: [facility];  Service: Orthopedics;  Laterality: Right;  Rigth shoulder diagnostic arthroscopy biceps tenotomy, open tenodesis, labral debridement   TONSILLECTOMY  2002    There were no vitals filed for this visit.   Subjective Assessment - 10/09/21 1505     Subjective I was doing better with mainly quad   soreness until last week. Went OOT and the pain returned in the upper part of the hip while I was walking.    Pertinent History Shoulder surgery x 2.    How long can you walk comfortably? Slow paced walking is fine.    Patient Stated Goals Get back to working out.    Currently in Pain? Yes    Pain Score 7     Pain Location Hip    Pain Orientation Right    Pain Descriptors / Indicators Sore    Pain Type Acute pain    Pain Onset 1 to 4 weeks ago                               [facility] Adult PT Treatment/Exercise - 10/09/21 0001       Exercises   Exercises Knee/Hip      Knee/Hip Exercises: Aerobic   Nustep L2 x 8 mins      Modalities   Modalities Electrical Stimulation;Vasopneumatic;Moist Heat      Moist Heat Therapy   Number Minutes Moist Heat 15 Minutes    Moist Heat Location --   RT quad     Electrical Stimulation   Electrical Stimulation Location RT lateral quad    Electrical Stimulation Action IFC x 15 mins    Electrical Stimulation Parameters 80-'150hz'$     Electrical Stimulation Goals Pain;Tone      Manual Therapy  Manual Therapy Soft tissue mobilization    Soft tissue mobilization STW/M/ IASTM  to patient's affected right entire quad f/b manual prone knee flexion stretching for rectus femoris                          PT Long Term Goals - 09/12/21 1023       PT LONG TERM GOAL #1   Title Independent with a HEP.    Time 6    Period Weeks    Status New      PT LONG TERM GOAL #2   Title Resume normal workouts without pain.    Time 6    Period Weeks    Status New      PT LONG TERM GOAL #3   Title Demonstrate a right quad strength grade of 5/5.    Time 6    Period Weeks    Status New                   Plan - 10/09/21 1515     Clinical Impression Statement Pt arrived today not doing as well as last visit due to increased quad pain after doing a lot of walking. Rx focused on light ex as well as STW/ IASTW to entire  RT quad f/b prone quad stretching. HMP and estim end of session. Pt to F/U with MD today    Personal Factors and Comorbidities Other    Examination-Activity Limitations Locomotion Level    Examination-Participation Restrictions Other    Stability/Clinical Decision Making Stable/Uncomplicated    Rehab Potential Excellent    PT Frequency 2x / week    PT Treatment/Interventions ADLs/Self Care Home Management;Cryotherapy;Electrical Stimulation;Ultrasound;Moist Heat;Functional mobility training;Therapeutic activities;Therapeutic exercise;Neuromuscular re-education;Manual techniques;Passive range of motion;Vasopneumatic Device    PT Next Visit Plan Pulsed combo e'stim/US, STW/M, vasopneumatic and electrical stimulation, begin with low-level recumbent bike, pain-free right quad strengthening.    Consulted and Agree with Plan of Care Patient             Patient will benefit from skilled therapeutic intervention in order to improve the following deficits and impairments:  Pain, Increased muscle spasms, Decreased strength, Decreased activity tolerance  Visit Diagnosis: Pain in right thigh  Muscle weakness (generalized)  Other muscle spasm     Problem List There are no problems to display for this patient.   Hayzel Ruberg,CHRIS, PTA 10/09/2021, 5:29 PM  [facility] [place], [place], [address] Phone: [phone]   Fax:  [phone]  Name: Rebecca Pearson MRN: [id number] Date of Birth: 1981-04-02

## 2021-10-11 ENCOUNTER — Ambulatory Visit: Payer: 59 | Admitting: *Deleted

## 2021-10-11 DIAGNOSIS — M62838 Other muscle spasm: Secondary | ICD-10-CM

## 2021-10-11 DIAGNOSIS — M79651 Pain in right thigh: Secondary | ICD-10-CM | POA: Diagnosis not present

## 2021-10-11 DIAGNOSIS — M6281 Muscle weakness (generalized): Secondary | ICD-10-CM

## 2021-10-11 NOTE — Therapy (Signed)
[facility] [place], [place], [address] Phone: [phone]   Fax:  [phone]  Physical Therapy Treatment  Patient Details  Name: Rebecca Pearson MRN: [id number] Date of Birth: [birth year] Referring Provider (PT): Dorna Leitz MD   Encounter Date: 10/11/2021   PT End of Session - 10/11/21 1044     Visit Number 6    Number of Visits 12    Date for PT Re-Evaluation 10/24/21    Authorization Type FOTO.    PT Start Time 1030    PT Stop Time 1120    PT Time Calculation (min) 50 min             Past Medical History:  Diagnosis Date   Atypical nevus 07/31/2012   Right Submammary   Biliary colic 01/6282   Gastroesophageal reflux disease    History of colitis    History of migraine headaches    History of palpitations    no current med.   History of thrombocytopenia    only during pregnancy   PONV (postoperative nausea and vomiting)     Past Surgical History:  Procedure Laterality Date   CHOLECYSTECTOMY N/A 02/10/2017   Procedure: LAPAROSCOPIC CHOLECYSTECTOMY;  Surgeon: Rolm Bookbinder, MD;  Location: [place];  Service: General;  Laterality: N/A;   RADIOACTIVE SEED GUIDED EXCISIONAL BREAST BIOPSY Left 05/18/2020   Procedure: LEFT RADIOACTIVE SEED GUIDED EXCISIONAL BREAST BIOPSY;  Surgeon: Rolm Bookbinder, MD;  Location: [place];  Service: General;  Laterality: Left;   SHOULDER ARTHROSCOPY WITH BICEPSTENOTOMY Right 01/30/2015   Procedure: SHOULDER ARTHROSCOPY WITH BICEPS TENOTOMY, OPEN TENODESIS,LABRAL DEBRIDEMENT;  Surgeon: Tania Ade, MD;  Location: [place];  Service: Orthopedics;  Laterality: Right;  Rigth shoulder diagnostic arthroscopy biceps tenotomy, open tenodesis, labral debridement   TONSILLECTOMY  2002    There were no vitals filed for this visit.   Subjective Assessment - 10/11/21 1040     Subjective MD  said  to cont. x 2 more weeks and  put me on prednisone. Xray (-) for back.    Pertinent History Shoulder surgery x 2.    How long can you walk comfortably? Slow paced walking is fine.    Patient Stated Goals Get back to working out.    Currently in Pain? Yes    Pain Score 5     Pain Location Hip    Pain Orientation Right    Pain Descriptors / Indicators Aching;Sore    Pain Type Acute pain    Pain Onset 1 to 4 weeks ago                               [facility] Adult PT Treatment/Exercise - 10/11/21 0001       Exercises   Exercises Knee/Hip      Knee/Hip Exercises: Aerobic   Nustep L2 x 8 mins      Modalities   Modalities Electrical Stimulation;Vasopneumatic;Moist Heat      Moist Heat Therapy   Number Minutes Moist Heat 15 Minutes    Moist Heat Location --   RT quad     Electrical Stimulation   Electrical Stimulation Location RT lateral quad    Electrical Stimulation Action IFC x15 mins    Electrical Stimulation Parameters 80-'150hz'$     Electrical Stimulation Goals Pain;Tone      Manual Therapy   Manual Therapy Soft tissue mobilization    Soft  tissue mobilization STW/M/ IASTM  to patient's affected right entire quad f/b manual prone knee flexion stretching for rectus femoris                          PT Long Term Goals - 09/12/21 1023       PT LONG TERM GOAL #1   Title Independent with a HEP.    Time 6    Period Weeks    Status New      PT LONG TERM GOAL #2   Title Resume normal workouts without pain.    Time 6    Period Weeks    Status New      PT LONG TERM GOAL #3   Title Demonstrate a right quad strength grade of 5/5.    Time 6    Period Weeks    Status New                   Plan - 10/11/21 1646     Clinical Impression Statement Pt arrived today doing about the same with increased quad and ant. hip pain. She reports MD did an xray of her LB to rule it out and it was (-). She was able to perform the bike and nustep with minimal resistance for ROM,  but still with discomfort. STW and IASTM performed f/b stretching in prone. Estim and hMP end of session tolerated well.    Personal Factors and Comorbidities Other    Examination-Activity Limitations Locomotion Level    Stability/Clinical Decision Making Stable/Uncomplicated    Rehab Potential Excellent    PT Frequency 2x / week    PT Duration 6 weeks    PT Next Visit Plan Pulsed combo e'stim/US, STW/M, vasopneumatic and electrical stimulation, begin with low-level recumbent bike, pain-free right quad strengthening.    Consulted and Agree with Plan of Care Patient             Patient will benefit from skilled therapeutic intervention in order to improve the following deficits and impairments:  Pain, Increased muscle spasms, Decreased strength, Decreased activity tolerance  Visit Diagnosis: Pain in right thigh  Muscle weakness (generalized)  Other muscle spasm     Problem List There are no problems to display for this patient.   Starlette Thurow,CHRIS, PTA 10/11/2021, 4:55 PM  [facility] [place], [place], [address] Phone: [phone]   Fax:  [phone]  Name: Rebecca Pearson MRN: [id number] Date of Birth: July 09, 1980

## 2021-10-16 ENCOUNTER — Ambulatory Visit: Payer: 59 | Admitting: Physical Therapy

## 2021-10-16 DIAGNOSIS — M79651 Pain in right thigh: Secondary | ICD-10-CM

## 2021-10-16 DIAGNOSIS — M6281 Muscle weakness (generalized): Secondary | ICD-10-CM

## 2021-10-16 DIAGNOSIS — M62838 Other muscle spasm: Secondary | ICD-10-CM

## 2021-10-16 NOTE — Therapy (Signed)
[facility] [place], [place], [address] Phone: [phone]   Fax:  [phone]  Physical Therapy Treatment  Patient Details  Name: Rebecca Pearson MRN: [id number] Date of Birth: Feb 13, 1981 Referring Provider (PT): Dorna Leitz MD   Encounter Date: 10/16/2021   PT End of Session - 10/16/21 1151     Visit Number 7    Number of Visits 12    Date for PT Re-Evaluation 10/24/21    Authorization Type FOTO.    PT Start Time 1034    PT Stop Time 1127    PT Time Calculation (min) 53 min    Activity Tolerance Patient tolerated treatment well    Behavior During Therapy WFL for tasks assessed/performed             Past Medical History:  Diagnosis Date   Atypical nevus 07/31/2012   Right Submammary   Biliary colic 38/1017   Gastroesophageal reflux disease    History of colitis    History of migraine headaches    History of palpitations    no current med.   History of thrombocytopenia    only during pregnancy   PONV (postoperative nausea and vomiting)     Past Surgical History:  Procedure Laterality Date   CHOLECYSTECTOMY N/A 02/10/2017   Procedure: LAPAROSCOPIC CHOLECYSTECTOMY;  Surgeon: Rolm Bookbinder, MD;  Location: [place];  Service: General;  Laterality: N/A;   RADIOACTIVE SEED GUIDED EXCISIONAL BREAST BIOPSY Left 05/18/2020   Procedure: LEFT RADIOACTIVE SEED GUIDED EXCISIONAL BREAST BIOPSY;  Surgeon: Rolm Bookbinder, MD;  Location: [place];  Service: General;  Laterality: Left;   SHOULDER ARTHROSCOPY WITH BICEPSTENOTOMY Right 01/30/2015   Procedure: SHOULDER ARTHROSCOPY WITH BICEPS TENOTOMY, OPEN TENODESIS,LABRAL DEBRIDEMENT;  Surgeon: Tania Ade, MD;  Location: [place];  Service: Orthopedics;  Laterality: Right;  Rigth shoulder diagnostic arthroscopy biceps tenotomy, open tenodesis, labral debridement   TONSILLECTOMY  2002    There were no vitals  filed for this visit.   Subjective Assessment - 10/16/21 1145     Subjective About a 5 today.    Pertinent History Shoulder surgery x 2.    How long can you walk comfortably? Slow paced walking is fine.    Patient Stated Goals Get back to working out.    Currently in Pain? Yes    Pain Score 5     Pain Location Hip    Pain Orientation Right    Pain Descriptors / Indicators Aching;Sore    Pain Type Acute pain                               [facility] Adult PT Treatment/Exercise - 10/16/21 0001       Exercises   Exercises Knee/Hip      Knee/Hip Exercises: Aerobic   Recumbent Bike Level 3 x 10 minutes.      Electrical Stimulation   Electrical Stimulation Location Right Rectus Femoris and lateral quadriceps.    Electrical Stimulation Action IFC at 80-150 Hz.    Electrical Stimulation Parameters 40% scan x 20 minutes.    Electrical Stimulation Goals Pain;Tone      Ultrasound   Ultrasound Location Right Rectus Femoris to lateral quadriceps region.    Ultrasound Parameters Combo e'stim/US at 1.50 W/CM2 x 10 minutes.      Manual Therapy   Manual Therapy Joint mobilization    Soft tissue mobilization Gentle belt mobs  in supine to patient's right hip and femoral traction x 3 minutes.                          PT Long Term Goals - 09/12/21 1023       PT LONG TERM GOAL #1   Title Independent with a HEP.    Time 6    Period Weeks    Status New      PT LONG TERM GOAL #2   Title Resume normal workouts without pain.    Time 6    Period Weeks    Status New      PT LONG TERM GOAL #3   Title Demonstrate a right quad strength grade of 5/5.    Time 6    Period Weeks    Status New                   Plan - 10/16/21 1155     Clinical Impression Statement Patient pain staying at a 5.  She has pain complaints in the region of her right Rectus Femoris and groin and lateral quads.  Performed gentle hip mobs with belt and femoral traction  without complaint.    Personal Factors and Comorbidities Other    Examination-Activity Limitations Locomotion Level    Examination-Participation Restrictions Other    Stability/Clinical Decision Making Stable/Uncomplicated    Rehab Potential Excellent    PT Frequency 2x / week    PT Duration 6 weeks    PT Next Visit Plan Pulsed combo e'stim/US, STW/M, vasopneumatic and electrical stimulation, begin with low-level recumbent bike, pain-free right quad strengthening.    Consulted and Agree with Plan of Care Patient             Patient will benefit from skilled therapeutic intervention in order to improve the following deficits and impairments:  Pain, Increased muscle spasms, Decreased strength, Decreased activity tolerance  Visit Diagnosis: Pain in right thigh  Muscle weakness (generalized)  Other muscle spasm     Problem List There are no problems to display for this patient.   Veasna Santibanez, [name], PT 10/16/2021, 11:59 AM  [facility] [address] [place], [place], [address] Phone: [phone]   Fax:  [phone]  Name: TASHEIKA KITZMILLER MRN: [id number] Date of Birth: 06-29-1980

## 2021-10-18 ENCOUNTER — Ambulatory Visit: Payer: 59 | Attending: Orthopedic Surgery | Admitting: *Deleted

## 2021-10-18 DIAGNOSIS — M79651 Pain in right thigh: Secondary | ICD-10-CM | POA: Insufficient documentation

## 2021-10-18 DIAGNOSIS — M62838 Other muscle spasm: Secondary | ICD-10-CM | POA: Diagnosis present

## 2021-10-18 DIAGNOSIS — M6281 Muscle weakness (generalized): Secondary | ICD-10-CM | POA: Diagnosis present

## 2021-10-18 NOTE — Therapy (Signed)
[facility] [place], [place], [address] Phone: [phone]   Fax:  [phone]  Physical Therapy Treatment  Patient Details  Name: Rebecca Pearson MRN: [id number] Date of Birth: Oct 13, 1980 Referring Provider (PT): Dorna Leitz MD   Encounter Date: 10/18/2021   PT End of Session - 10/18/21 1131     Visit Number 8    Number of Visits 12    Date for PT Re-Evaluation 10/24/21    Authorization Type FOTO.    PT Start Time 1115    PT Stop Time 1210    PT Time Calculation (min) 55 min             Past Medical History:  Diagnosis Date   Atypical nevus 07/31/2012   Right Submammary   Biliary colic 02/6268   Gastroesophageal reflux disease    History of colitis    History of migraine headaches    History of palpitations    no current med.   History of thrombocytopenia    only during pregnancy   PONV (postoperative nausea and vomiting)     Past Surgical History:  Procedure Laterality Date   CHOLECYSTECTOMY N/A 02/10/2017   Procedure: LAPAROSCOPIC CHOLECYSTECTOMY;  Surgeon: Rolm Bookbinder, MD;  Location: [place];  Service: General;  Laterality: N/A;   RADIOACTIVE SEED GUIDED EXCISIONAL BREAST BIOPSY Left 05/18/2020   Procedure: LEFT RADIOACTIVE SEED GUIDED EXCISIONAL BREAST BIOPSY;  Surgeon: Rolm Bookbinder, MD;  Location: [place];  Service: General;  Laterality: Left;   SHOULDER ARTHROSCOPY WITH BICEPSTENOTOMY Right 01/30/2015   Procedure: SHOULDER ARTHROSCOPY WITH BICEPS TENOTOMY, OPEN TENODESIS,LABRAL DEBRIDEMENT;  Surgeon: Tania Ade, MD;  Location: [place];  Service: Orthopedics;  Laterality: Right;  Rigth shoulder diagnostic arthroscopy biceps tenotomy, open tenodesis, labral debridement   TONSILLECTOMY  2002    There were no vitals filed for this visit.   Subjective Assessment - 10/18/21 1038     Subjective RT hip pain 5-6/10 at times  and  increased pain for no reason at times as well. To MD Tuesday next week.    Pertinent History Shoulder surgery x 2.    How long can you walk comfortably? Slow paced walking is fine.    Patient Stated Goals Get back to working out.    Currently in Pain? Yes    Pain Score 5     Pain Location Hip    Pain Orientation Right    Pain Descriptors / Indicators Aching;Sore    Pain Type Acute pain    Pain Onset 1 to 4 weeks ago                               [facility] Adult PT Treatment/Exercise - 10/18/21 0001       Exercises   Exercises Knee/Hip      Knee/Hip Exercises: Aerobic   Recumbent Bike Level 3 x 5 minutes.    Nustep L2 x 5 mins      Modalities   Modalities Electrical Stimulation;Vasopneumatic;Moist Heat      Moist Heat Therapy   Number Minutes Moist Heat 15 Minutes    Moist Heat Location --   RT quad     Electrical Stimulation   Electrical Stimulation Location Right Rectus Femoris and lateral quadriceps.    Electrical Stimulation Action IFC x15 mins    Electrical Stimulation Parameters 80-'150hz'$     Electrical Stimulation Goals Pain;Tone  Manual Therapy   Soft tissue mobilization STW/M/ IASTM  to patient's affected right entire quad f/b manual prone knee flexion stretching for rectus femoris                          PT Long Term Goals - 09/12/21 1023       PT LONG TERM GOAL #1   Title Independent with a HEP.    Time 6    Period Weeks    Status New      PT LONG TERM GOAL #2   Title Resume normal workouts without pain.    Time 6    Period Weeks    Status New      PT LONG TERM GOAL #3   Title Demonstrate a right quad strength grade of 5/5.    Time 6    Period Weeks    Status New                   Plan - 10/18/21 1132     Clinical Impression Statement Pt arrived today doing about the same since irritating RT hip 2 weeks ago. Pain 2-3/10 most of the time, but increases with activity. Pain is still in the quad, but  also at the anterior aspect near rectus femoris  ASIS area.. Rx focused on light exercise, STW and passive prone stretching. To MD on Tuesday    Personal Factors and Comorbidities Other    Examination-Activity Limitations Locomotion Level    Stability/Clinical Decision Making Stable/Uncomplicated    Rehab Potential Excellent    PT Frequency 2x / week    PT Treatment/Interventions ADLs/Self Care Home Management;Cryotherapy;Electrical Stimulation;Ultrasound;Moist Heat;Functional mobility training;Therapeutic activities;Therapeutic exercise;Neuromuscular re-education;Manual techniques;Passive range of motion;Vasopneumatic Device    PT Next Visit Plan To MD next Tuesday. Send Progress note    Consulted and Agree with Plan of Care Patient             Patient will benefit from skilled therapeutic intervention in order to improve the following deficits and impairments:  Pain, Increased muscle spasms, Decreased strength, Decreased activity tolerance  Visit Diagnosis: Pain in right thigh  Muscle weakness (generalized)  Other muscle spasm     Problem List There are no problems to display for this patient.  Rationale for Evaluation and Treatment Rehabilitation  Samin Milke,CHRIS, PTA 10/18/2021, 3:37 PM  [facility] [place], [place], [address] Phone: [phone]   Fax:  [phone]  Name: Rebecca Pearson MRN: [id number] Date of Birth: 1981/04/28

## 2021-10-31 ENCOUNTER — Ambulatory Visit: Payer: 59 | Admitting: Physical Therapy

## 2021-10-31 DIAGNOSIS — M62838 Other muscle spasm: Secondary | ICD-10-CM

## 2021-10-31 DIAGNOSIS — M6281 Muscle weakness (generalized): Secondary | ICD-10-CM

## 2021-10-31 DIAGNOSIS — M79651 Pain in right thigh: Secondary | ICD-10-CM

## 2021-10-31 NOTE — Therapy (Addendum)
[facility] [place], [place], [address] Phone: [phone]   Fax:  [phone]  Physical Therapy Treatment  Patient Details  Name: Rebecca Pearson MRN: [id number] Date of Birth: 30-Sep-1980 Referring Provider (PT): Dorna Leitz MD   Encounter Date: 10/31/2021   PT End of Session - 10/31/21 0941     Visit Number 9    Number of Visits 12    Date for PT Re-Evaluation 10/24/21    Authorization Type FOTO.    PT Start Time 0818    PT Stop Time 0911    PT Time Calculation (min) 53 min    Activity Tolerance Patient tolerated treatment well    Behavior During Therapy [facility] for tasks assessed/performed             Past Medical History:  Diagnosis Date   Atypical nevus 07/31/2012   Right Submammary   Biliary colic 63/8177   Gastroesophageal reflux disease    History of colitis    History of migraine headaches    History of palpitations    no current med.   History of thrombocytopenia    only during pregnancy   PONV (postoperative nausea and vomiting)     Past Surgical History:  Procedure Laterality Date   CHOLECYSTECTOMY N/A 02/10/2017   Procedure: LAPAROSCOPIC CHOLECYSTECTOMY;  Surgeon: Rolm Bookbinder, MD;  Location: [place];  Service: General;  Laterality: N/A;   RADIOACTIVE SEED GUIDED EXCISIONAL BREAST BIOPSY Left 05/18/2020   Procedure: LEFT RADIOACTIVE SEED GUIDED EXCISIONAL BREAST BIOPSY;  Surgeon: Rolm Bookbinder, MD;  Location: [place];  Service: General;  Laterality: Left;   SHOULDER ARTHROSCOPY WITH BICEPSTENOTOMY Right 01/30/2015   Procedure: SHOULDER ARTHROSCOPY WITH BICEPS TENOTOMY, OPEN TENODESIS,LABRAL DEBRIDEMENT;  Surgeon: Tania Ade, MD;  Location: [place];  Service: Orthopedics;  Laterality: Right;  Rigth shoulder diagnostic arthroscopy biceps tenotomy, open tenodesis, labral debridement   TONSILLECTOMY  2002    There were no vitals  filed for this visit.   Subjective Assessment - 10/31/21 0941     Subjective Getting MRI tomorrow.    Pertinent History Shoulder surgery x 2.    Patient Stated Goals Get back to working out.    Currently in Pain? Yes    Pain Score 3     Pain Location Hip    Pain Orientation Right    Pain Descriptors / Indicators Aching;Sore    Pain Type Acute pain    Pain Onset More than a month ago                               [facility] Adult PT Treatment/Exercise - 10/31/21 0001       Exercises   Exercises Knee/Hip      Knee/Hip Exercises: Aerobic   Recumbent Bike Level 3 x 15 minutes.      Modalities   Modalities Electrical Stimulation;Moist Heat      Moist Heat Therapy   Number Minutes Moist Heat 20 Minutes    Moist Heat Location --   LB.     Acupuncturist Location Right LB and latreral hip.    Electrical Stimulation Action IFC at 80-150 Hz.    Electrical Stimulation Parameters 40% scan x 20 minutes.    Electrical Stimulation Goals Pain;Tone      Manual Therapy   Manual Therapy Soft tissue mobilization    Soft tissue mobilization In  left sdly position with pillow between knees for comfort:  STW/M x 8 minutes to patient's left lateral hip and QL to reduce tone.              Trigger Point Dry Needling - 10/31/21 0001     Consent Given? Yes    Education Handout Provided Yes    Muscles Treated Back/Hip Gluteus medius;Tensor fascia lata    Gluteus Medius Response Twitch response elicited    Tensor Fascia Lata Response Twitch response elicited                        PT Long Term Goals - 09/12/21 1023       PT LONG TERM GOAL #1   Title Independent with a HEP.    Time 6    Period Weeks    Status New      PT LONG TERM GOAL #2   Title Resume normal workouts without pain.    Time 6    Period Weeks    Status New      PT LONG TERM GOAL #3   Title Demonstrate a right quad strength grade of 5/5.    Time 6     Period Weeks    Status New                   Plan - 10/31/21 0945     Clinical Impression Statement Patient did well with treatment today.  Excellent twitch response with dry needling to right TFL and glut med.  She was found to be tender to palpation over her right QL as well today.    Personal Factors and Comorbidities Other    Examination-Activity Limitations Locomotion Level    Examination-Participation Restrictions Other    Stability/Clinical Decision Making Stable/Uncomplicated    Rehab Potential Excellent    PT Frequency 2x / week    PT Duration 6 weeks    PT Treatment/Interventions ADLs/Self Care Home Management;Cryotherapy;Electrical Stimulation;Ultrasound;Moist Heat;Functional mobility training;Therapeutic activities;Therapeutic exercise;Neuromuscular re-education;Manual techniques;Passive range of motion;Vasopneumatic Device    PT Next Visit Plan Dry needling if helpful.    Consulted and Agree with Plan of Care Patient             Patient will benefit from skilled therapeutic intervention in order to improve the following deficits and impairments:  Pain, Increased muscle spasms, Decreased strength, Decreased activity tolerance  Visit Diagnosis: Pain in right thigh  Muscle weakness (generalized)  Other muscle spasm     Problem List There are no problems to display for this patient.  Rationale for Evaluation and Treatment Rehabilitation.  Reygan Heagle, [name], PT 10/31/2021, 9:47 AM  [facility] [place], [place], [address] Phone: [phone]   Fax:  [phone]  Name: Rebecca Pearson MRN: [id number] Date of Birth: Jun 15, 1980   PHYSICAL THERAPY DISCHARGE SUMMARY  Visits from Start of Care: 9.  Current functional level related to goals / functional outcomes: See above.   Remaining deficits: See below.   Education / Equipment: HEP.   Patient agrees to discharge. Patient goals were  not met. Patient is being discharged due to lack of progress.    [name] Anastassia Noack MPT

## 2021-11-06 ENCOUNTER — Ambulatory Visit: Payer: 59 | Admitting: Physician Assistant

## 2022-01-23 ENCOUNTER — Ambulatory Visit: Payer: 59 | Admitting: Physician Assistant

## 2022-02-13 DIAGNOSIS — J309 Allergic rhinitis, unspecified: Secondary | ICD-10-CM | POA: Insufficient documentation

## 2022-02-13 DIAGNOSIS — N3020 Other chronic cystitis without hematuria: Secondary | ICD-10-CM | POA: Insufficient documentation

## 2022-02-13 DIAGNOSIS — G43909 Migraine, unspecified, not intractable, without status migrainosus: Secondary | ICD-10-CM | POA: Insufficient documentation

## 2022-02-13 DIAGNOSIS — R87612 Low grade squamous intraepithelial lesion on cytologic smear of cervix (LGSIL): Secondary | ICD-10-CM | POA: Insufficient documentation

## 2022-02-13 DIAGNOSIS — N83209 Unspecified ovarian cyst, unspecified side: Secondary | ICD-10-CM | POA: Insufficient documentation

## 2022-02-13 DIAGNOSIS — M25851 Other specified joint disorders, right hip: Secondary | ICD-10-CM | POA: Insufficient documentation

## 2022-02-13 DIAGNOSIS — J301 Allergic rhinitis due to pollen: Secondary | ICD-10-CM | POA: Insufficient documentation

## 2022-04-01 ENCOUNTER — Ambulatory Visit: Payer: 59 | Attending: Orthopaedic Surgery | Admitting: Physical Therapy

## 2022-04-01 ENCOUNTER — Other Ambulatory Visit: Payer: Self-pay

## 2022-04-01 ENCOUNTER — Encounter: Payer: Self-pay | Admitting: Physical Therapy

## 2022-04-01 DIAGNOSIS — M79651 Pain in right thigh: Secondary | ICD-10-CM | POA: Insufficient documentation

## 2022-04-01 DIAGNOSIS — M62838 Other muscle spasm: Secondary | ICD-10-CM | POA: Diagnosis present

## 2022-04-01 DIAGNOSIS — M6281 Muscle weakness (generalized): Secondary | ICD-10-CM | POA: Diagnosis present

## 2022-04-01 NOTE — Therapy (Signed)
OUTPATIENT PHYSICAL THERAPY LOWER EXTREMITY EVALUATION   Patient Name: Rebecca Pearson MRN: [id number] DOB:1981/01/19, 41 y.o., female Today's Date: 04/01/2022   PT End of Session - 04/01/22 1406     Visit Number 1    Number of Visits 12    Date for PT Re-Evaluation 05/27/22             Past Medical History:  Diagnosis Date   Atypical nevus 07/31/2012   Right Submammary   Biliary colic 29/9371   Gastroesophageal reflux disease    History of colitis    History of migraine headaches    History of palpitations    no current med.   History of thrombocytopenia    only during pregnancy   PONV (postoperative nausea and vomiting)    Past Surgical History:  Procedure Laterality Date   CHOLECYSTECTOMY N/A 02/10/2017   Procedure: LAPAROSCOPIC CHOLECYSTECTOMY;  Surgeon: Rolm Bookbinder, MD;  Location: [place];  Service: General;  Laterality: N/A;   RADIOACTIVE SEED GUIDED EXCISIONAL BREAST BIOPSY Left 05/18/2020   Procedure: LEFT RADIOACTIVE SEED GUIDED EXCISIONAL BREAST BIOPSY;  Surgeon: Rolm Bookbinder, MD;  Location: [place];  Service: General;  Laterality: Left;   SHOULDER ARTHROSCOPY WITH BICEPSTENOTOMY Right 01/30/2015   Procedure: SHOULDER ARTHROSCOPY WITH BICEPS TENOTOMY, OPEN TENODESIS,LABRAL DEBRIDEMENT;  Surgeon: Tania Ade, MD;  Location: [place];  Service: Orthopedics;  Laterality: Right;  Rigth shoulder diagnostic arthroscopy biceps tenotomy, open tenodesis, labral debridement   TONSILLECTOMY  2002   There are no problems to display for this patient.   REFERRING PROVIDER: Orvilla Fus MD  REFERRING DIAG: Right hip impingement syndrome.  THERAPY DIAG:  Pain in right thigh  Muscle weakness (generalized)  Rationale for Evaluation and Treatment: Rehabilitation  ONSET DATE: April 2023.  SUBJECTIVE:   SUBJECTIVE STATEMENT: The patient presents to the clinic s/p left hip arthroscopic surgery  performed on 03/15/22 for labral repair, femoroplasty, osteochondroplasty.  She presented to the clinic today with bilateral axillary crutches and is wbat over her right LE.  She states she can be full weigh bearing and progress to one crutch but she doesn't feel she is quite ready for this yet.  She is allowed to ride her Peloton bike for 20 minutes a day with no resistance and RPM less than 80 and no clip on shoes.  Her MD wants her to avoid recumbent steppers and bikes in the PT clinic.  PERTINENT HISTORY: Right hip injury in April doing a backward lunge. PAIN:  Are you having pain? Yes: NPRS scale: 4/10 Pain location: Right hip. Pain description: Ache, sore. Aggravating factors: Movement. Relieving factors: Meds.  PRECAUTIONS: Other: Please see protocol.  WEIGHT BEARING RESTRICTIONS:  Patient able to full weight bear but currently wbat  with two axillary crutches.  FALLS:  Has patient fallen in last 6 months? 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  PLOF: Independent  PATIENT GOALS: Get back to normal.  NEXT MD VISIT:   OBJECTIVE:   PATIENT SURVEYS:  FOTO    PALPATION: Tender   LOWER EXTREMITY ROM: In supine:  Gentle passive right hip flexion performed to 50 degrees.  She has full right knee extension.  LOWER EXTREMITY MMT:  Significant decrease in volitional right quadriceps activation as expected.  PALPATION:  Tender palpation around her right hip scope sites.  Steri-strips intact.  TRANSFERS:  Patient using Ue's to assist  her right LE from sit to supine to sit.  GAIT: Patient ambulating with bilateral axillary crutches wbat over her right LE.   TODAY'S TREATMENT:                                                                                                                              DATE: Evaluation.  Discussed at length protocol.  Patient going to  begin her Peloton exercise per surgeon and begin on quad activation doing quad sets.     ASSESSMENT:  CLINICAL IMPRESSION:  The patient presents to [facility] s/p right hip arthroscopy with labral repair, femoroplasty and osteochondroplasty.  She is currently ambulating wbat over her right LE with bilateral axillary crutches. She has very limited volitional activation of her right quadriceps at this time.  She was able to achieve gentle right passive hip flexion to 50 degrees today.  She is going to begin non-resisted Peloton exercise at home and work on quad activation via the quad set exercise.  Will progress patient per protocol.   Patient will benefit from skilled physical therapy intervention to address pain and deficits.  OBJECTIVE IMPAIRMENTS: Abnormal gait, decreased balance, decreased ROM, decreased strength, and pain.   ACTIVITY LIMITATIONS: locomotion level  PARTICIPATION LIMITATIONS: meal prep and cleaning  REHAB POTENTIAL: Excellent  CLINICAL DECISION MAKING: Stable/uncomplicated  EVALUATION COMPLEXITY: Low   GOALS: LONG TERM GOALS: Target date: 05/27/2022   Ind with an advanced HEP per protocol. Baseline:  Goal status: INITIAL  2.  Restore normal right hip range of motion. Baseline:  Goal status: INITIAL  3.  Restore normal right LE strength to increase stability for functional tasks. Baseline:  Goal status: INITIAL  4.  Patient perform ADL's with right hip pain not > 2/10. Baseline:  Goal status: INITIAL  5.  Resume pre-injury activities in accord with protocol guidelines. Baseline:  Goal status: INITIAL  PLAN:  PT FREQUENCY: 2x/week  PT DURATION: 6 weeks  PLANNED INTERVENTIONS: Therapeutic exercises, Therapeutic activity, Neuromuscular re-education, Balance training, Gait training, Patient/Family education, Self Care, Electrical stimulation, Cryotherapy, Moist heat, Vasopneumatic device, Ultrasound, and Manual therapy  PLAN FOR NEXT SESSION: Please progress  per protocol.  NMES to right quadriceps.   Rosa Wyly, [name], PT 04/01/2022, 2:07 PM

## 2022-04-09 ENCOUNTER — Ambulatory Visit: Payer: 59 | Admitting: Physical Therapy

## 2022-04-09 DIAGNOSIS — M79651 Pain in right thigh: Secondary | ICD-10-CM

## 2022-04-09 DIAGNOSIS — M6281 Muscle weakness (generalized): Secondary | ICD-10-CM

## 2022-04-09 NOTE — Therapy (Signed)
OUTPATIENT PHYSICAL THERAPY LOWER EXTREMITY EVALUATION   Patient Name: Rebecca Pearson MRN: [id number] DOB:23-Feb-1981, 41 y.o., female Today's Date: 04/09/2022   PT End of Session - 04/09/22 1128     Visit Number 2    Number of Visits 12    Date for PT Re-Evaluation 05/27/22    PT Start Time 1029    PT Stop Time 1113    PT Time Calculation (min) 44 min    Activity Tolerance Patient tolerated treatment well    Behavior During Therapy [facility] for tasks assessed/performed             Past Medical History:  Diagnosis Date   Atypical nevus 07/31/2012   Right Submammary   Biliary colic 42/5956   Gastroesophageal reflux disease    History of colitis    History of migraine headaches    History of palpitations    no current med.   History of thrombocytopenia    only during pregnancy   PONV (postoperative nausea and vomiting)    Past Surgical History:  Procedure Laterality Date   CHOLECYSTECTOMY N/A 02/10/2017   Procedure: LAPAROSCOPIC CHOLECYSTECTOMY;  Surgeon: Rolm Bookbinder, MD;  Location: [place];  Service: General;  Laterality: N/A;   RADIOACTIVE SEED GUIDED EXCISIONAL BREAST BIOPSY Left 05/18/2020   Procedure: LEFT RADIOACTIVE SEED GUIDED EXCISIONAL BREAST BIOPSY;  Surgeon: Rolm Bookbinder, MD;  Location: [place];  Service: General;  Laterality: Left;   SHOULDER ARTHROSCOPY WITH BICEPSTENOTOMY Right 01/30/2015   Procedure: SHOULDER ARTHROSCOPY WITH BICEPS TENOTOMY, OPEN TENODESIS,LABRAL DEBRIDEMENT;  Surgeon: Tania Ade, MD;  Location: [place];  Service: Orthopedics;  Laterality: Right;  Rigth shoulder diagnostic arthroscopy biceps tenotomy, open tenodesis, labral debridement   TONSILLECTOMY  2002   There are no problems to display for this patient.   REFERRING PROVIDER: Orvilla Fus MD  REFERRING DIAG: Right hip impingement syndrome.  THERAPY DIAG:  Pain in right thigh  Muscle weakness  (generalized)  Rationale for Evaluation and Treatment: Rehabilitation  ONSET DATE: April 2023.  SUBJECTIVE:   SUBJECTIVE STATEMENT: Patient used her Peloton twice and is now walking with one axillary crutch. PERTINENT HISTORY: Right hip injury in April doing a backward lunge. PAIN:  Are you having pain? Yes: NPRS scale: 4/10 Pain location: Right hip. Pain description: Ache, sore. Aggravating factors: Movement. Relieving factors: Meds.  PRECAUTIONS: Other: Please see protocol.  WEIGHT BEARING RESTRICTIONS:  Patient able to full weight bear but currently wbat  with two axillary crutches.  FALLS:  Has patient fallen in last 6 months? No  LIVING ENVIRONMENT: Lives with: lives with their spouse Lives in: House/apartment Has following equipment at home: [place]: Therapist, sports.  PLOF: Independent  PATIENT GOALS: Get back to normal.  NEXT MD VISIT:   OBJECTIVE:   Today's Tx:  In supine:  Gentle left hip passive flexion per patient tolerance, per protocol (10 minutes) f/b QS x 11 minutes f/b assisted SAQ's facilitated with 4 electrode VMS to quadriceps x 9 minutes with 10 sec extension holds f/b a 10 sec rest.  ASSESSMENT:  CLINICAL IMPRESSION:  The patient presented to to the clinic with one axillary crutch and weight bearing as tolerated over her right LE.  Progressed patient per protocol. Passive right hip flexion has improved nicely since her initial evaluation.  Patient requiring min+ assist to perform right SAQ facilitated with VMS.      GOALS: LONG TERM GOALS: Target date: 06/04/2022   Ind with an advanced HEP  per protocol. Baseline:  Goal status: INITIAL  2.  Restore normal right hip range of motion. Baseline:  Goal status: INITIAL  3.  Restore normal right LE strength to increase stability for functional tasks. Baseline:  Goal status: INITIAL  4.  Patient perform ADL's with right hip pain not > 2/10. Baseline:  Goal status: INITIAL  5.  Resume  pre-injury activities in accord with protocol guidelines. Baseline:  Goal status: INITIAL  PLAN:  PT FREQUENCY: 2x/week  PT DURATION: 6 weeks  PLANNED INTERVENTIONS: Therapeutic exercises, Therapeutic activity, Neuromuscular re-education, Balance training, Gait training, Patient/Family education, Self Care, Electrical stimulation, Cryotherapy, Moist heat, Vasopneumatic device, Ultrasound, and Manual therapy  PLAN FOR NEXT SESSION: Please progress per protocol.  NMES to right quadriceps.   [name], [name], PT 04/09/2022, 11:34 AM

## 2022-04-16 ENCOUNTER — Ambulatory Visit: Payer: 59 | Admitting: *Deleted

## 2022-04-16 ENCOUNTER — Encounter: Payer: Self-pay | Admitting: *Deleted

## 2022-04-16 DIAGNOSIS — M79651 Pain in right thigh: Secondary | ICD-10-CM

## 2022-04-16 DIAGNOSIS — M6281 Muscle weakness (generalized): Secondary | ICD-10-CM

## 2022-04-16 DIAGNOSIS — M62838 Other muscle spasm: Secondary | ICD-10-CM

## 2022-04-16 NOTE — Therapy (Signed)
OUTPATIENT PHYSICAL THERAPY LOWER EXTREMITY TREATMENT   Patient Name: Rebecca Pearson MRN: [id number] DOB:05-30-1980, 41 y.o., female Today's Date: 04/16/2022   PT End of Session - 04/16/22 1101     Visit Number 3    Number of Visits 12    Date for PT Re-Evaluation 05/27/22    PT Start Time 1030    PT Stop Time 1112    PT Time Calculation (min) 42 min             Past Medical History:  Diagnosis Date   Atypical nevus 07/31/2012   Right Submammary   Biliary colic 78/2956   Gastroesophageal reflux disease    History of colitis    History of migraine headaches    History of palpitations    no current med.   History of thrombocytopenia    only during pregnancy   PONV (postoperative nausea and vomiting)    Past Surgical History:  Procedure Laterality Date   CHOLECYSTECTOMY N/A 02/10/2017   Procedure: LAPAROSCOPIC CHOLECYSTECTOMY;  Surgeon: Rolm Bookbinder, MD;  Location: [place];  Service: General;  Laterality: N/A;   RADIOACTIVE SEED GUIDED EXCISIONAL BREAST BIOPSY Left 05/18/2020   Procedure: LEFT RADIOACTIVE SEED GUIDED EXCISIONAL BREAST BIOPSY;  Surgeon: Rolm Bookbinder, MD;  Location: [place];  Service: General;  Laterality: Left;   SHOULDER ARTHROSCOPY WITH BICEPSTENOTOMY Right 01/30/2015   Procedure: SHOULDER ARTHROSCOPY WITH BICEPS TENOTOMY, OPEN TENODESIS,LABRAL DEBRIDEMENT;  Surgeon: Tania Ade, MD;  Location: [place];  Service: Orthopedics;  Laterality: Right;  Rigth shoulder diagnostic arthroscopy biceps tenotomy, open tenodesis, labral debridement   TONSILLECTOMY  2002   There are no problems to display for this patient.   REFERRING PROVIDER: Orvilla Fus MD  REFERRING DIAG: Right hip impingement syndrome.  THERAPY DIAG:  Pain in right thigh  Other muscle spasm  Muscle weakness (generalized)  Rationale for Evaluation and Treatment: Rehabilitation  ONSET DATE: April  2023.  SUBJECTIVE:   SUBJECTIVE STATEMENT: Patient used her Peloton twice and is now walking with one axillary crutch. RT knee has been buckling on me some PERTINENT HISTORY: Right hip injury in April doing a backward lunge. PAIN:  Are you having pain? Yes: NPRS scale: 4/10 Pain location: Right hip. Pain description: Ache, sore. Aggravating factors: Movement. Relieving factors: Meds.  PRECAUTIONS: Other: Please see protocol.  WEIGHT BEARING RESTRICTIONS:  Patient able to full weight bear but currently wbat  with two axillary crutches.  FALLS:  Has patient fallen in last 6 months? No  LIVING ENVIRONMENT: Lives with: lives with their spouse Lives in: House/apartment Has following equipment at home: [place]: Therapist, sports.  PLOF: Independent  PATIENT GOALS: Get back to normal.  NEXT MD VISIT:   OBJECTIVE:   Today's Tx:  04-16-22  In supine:  Gentle left hip passive flexion per patient tolerance, per protocol (9 minutes)   QS x 12 minutes f/b assisted SAQ's facilitated with 4 electrode VMS to quadriceps x 8mnutes with 10 sec extension holds f/b a 10 sec rest. Started with manual assist, but able to finfish actively  ASSESSMENT:  CLINICAL IMPRESSION:  Pt arrived today doing about the same as far as pain RT hip. Rx focused on hip flexion PROM and quad activation with VMS facilitation  With QS and SAQ. Pt able to perform SAQ's independently end of session. Pt to ice at home GOALS: LONG TERM GOALS: Target date: 06/11/2022   Ind with an advanced HEP per protocol. Baseline:  Goal  status: INITIAL  2.  Restore normal right hip range of motion. Baseline:  Goal status: INITIAL  3.  Restore normal right LE strength to increase stability for functional tasks. Baseline:  Goal status: INITIAL  4.  Patient perform ADL's with right hip pain not > 2/10. Baseline:  Goal status: INITIAL  5.  Resume pre-injury activities in accord with protocol guidelines. Baseline:   Goal status: INITIAL  PLAN:  PT FREQUENCY: 2x/week  PT DURATION: 6 weeks  PLANNED INTERVENTIONS: Therapeutic exercises, Therapeutic activity, Neuromuscular re-education, Balance training, Gait training, Patient/Family education, Self Care, Electrical stimulation, Cryotherapy, Moist heat, Vasopneumatic device, Ultrasound, and Manual therapy  PLAN FOR NEXT SESSION: Please progress per protocol.  NMES to right quadriceps.   Adley Mazurowski,CHRIS, PTA 04/16/2022, 3:45 PM

## 2022-04-23 ENCOUNTER — Ambulatory Visit: Payer: 59 | Attending: Orthopaedic Surgery | Admitting: *Deleted

## 2022-04-23 ENCOUNTER — Encounter: Payer: Self-pay | Admitting: *Deleted

## 2022-04-23 DIAGNOSIS — M62838 Other muscle spasm: Secondary | ICD-10-CM | POA: Insufficient documentation

## 2022-04-23 DIAGNOSIS — M79651 Pain in right thigh: Secondary | ICD-10-CM | POA: Insufficient documentation

## 2022-04-23 DIAGNOSIS — M6281 Muscle weakness (generalized): Secondary | ICD-10-CM | POA: Insufficient documentation

## 2022-04-23 NOTE — Therapy (Signed)
OUTPATIENT PHYSICAL THERAPY LOWER EXTREMITY TREATMENT   Patient Name: Rebecca Pearson MRN: [id number] DOB:1981-03-14, 41 y.o., female Today's Date: 04/23/2022   PT End of Session - 04/23/22 1029     Visit Number 4    Number of Visits 12    Date for PT Re-Evaluation 05/27/22    PT Start Time 1030    PT Stop Time 1120    PT Time Calculation (min) 50 min             Past Medical History:  Diagnosis Date   Atypical nevus 07/31/2012   Right Submammary   Biliary colic 67/3419   Gastroesophageal reflux disease    History of colitis    History of migraine headaches    History of palpitations    no current med.   History of thrombocytopenia    only during pregnancy   PONV (postoperative nausea and vomiting)    Past Surgical History:  Procedure Laterality Date   CHOLECYSTECTOMY N/A 02/10/2017   Procedure: LAPAROSCOPIC CHOLECYSTECTOMY;  Surgeon: Rolm Bookbinder, MD;  Location: [place];  Service: General;  Laterality: N/A;   RADIOACTIVE SEED GUIDED EXCISIONAL BREAST BIOPSY Left 05/18/2020   Procedure: LEFT RADIOACTIVE SEED GUIDED EXCISIONAL BREAST BIOPSY;  Surgeon: Rolm Bookbinder, MD;  Location: [place];  Service: General;  Laterality: Left;   SHOULDER ARTHROSCOPY WITH BICEPSTENOTOMY Right 01/30/2015   Procedure: SHOULDER ARTHROSCOPY WITH BICEPS TENOTOMY, OPEN TENODESIS,LABRAL DEBRIDEMENT;  Surgeon: Tania Ade, MD;  Location: [place];  Service: Orthopedics;  Laterality: Right;  Rigth shoulder diagnostic arthroscopy biceps tenotomy, open tenodesis, labral debridement   TONSILLECTOMY  2002   There are no problems to display for this patient.   REFERRING PROVIDER: Orvilla Fus MD  REFERRING DIAG: Right hip impingement syndrome.  THERAPY DIAG:  Pain in right thigh  Other muscle spasm  Muscle weakness (generalized)  Rationale for Evaluation and Treatment: Rehabilitation  ONSET DATE: April 2023.  SUBJECTIVE:    SUBJECTIVE STATEMENT: Having a good day today, but had a bad day "Sunday and pain comes and goes for no reason. Hip is also clicking at times as well. To MD on Thursday   PERTINENT HISTORY: Right hip injury in April doing a backward lunge. PAIN:  Are you having pain? Yes: NPRS scale: 3-4/10 Pain location: Right hip. Pain description: Ache, sore. Aggravating factors: Movement. Relieving factors: Meds.  PRECAUTIONS: Other: Please see protocol.  WEIGHT BEARING RESTRICTIONS:  Patient able to full weight bear but currently wbat  with two axillary crutches.  FALLS:  Has patient fallen in last 6 months? No  LIVING ENVIRONMENT: Lives with: lives with their spouse Lives in: House/apartment Has following equipment at home: Crutches  OCCUPATION: RN.  PLOF: Independent  PATIENT GOALS: Get back to normal.  NEXT MD VISIT:   OBJECTIVE:   Today's Tx:                                                                           12"$ -5-23                                     EXERCISE  LOG  Exercise Repetitions and Resistance Comments  Rocker board  Calf stretching and balance   Hip ADD ball squeeze X 10 hold 5 secs   Hip ABD  isometrics Red band X10 hold 5 secs    Hip ext isometrics  Hooklying position x 10 hold 5 secs   Bent knee fallouts X10     Blank cell = exercise not performed today   Pt purchased NMES/ TENS machine for use at home and was instructed in use.  SAQ's facilitated with 4 electrode VMS to quadriceps x 10 minutes with 10 sec extension holds f/b a 10 sec rest. Started with manual assist, but able to finfish actively  ASSESSMENT:  CLINICAL IMPRESSION:   Pt arrived today doing about the same as far as pain RT hip. Rx focused on hip muscle activation exs with isometrics , AROM,  as well as  quad activation with VMS facilitation ( No assistance for terminal knee extension today). Pt still with intermittent RT hip pain throughout the day. Pt was 5 weeks post-op on 04-19-22  and will ask MD where she should be as far as per protocol due to not starting until 2 weeks post-op.    Pt to ice at home   GOALS: LONG TERM GOALS: Target date: 06/18/2022   Ind with an advanced HEP per protocol. Baseline:  Goal status: INITIAL  2.  Restore normal right hip range of motion. Baseline:  Goal status: INITIAL  3.  Restore normal right LE strength to increase stability for functional tasks. Baseline:  Goal status: INITIAL  4.  Patient perform ADL's with right hip pain not > 2/10. Baseline:  Goal status: INITIAL  5.  Resume pre-injury activities in accord with protocol guidelines. Baseline:  Goal status: INITIAL  PLAN:  PT FREQUENCY: 2x/week  PT DURATION: 6 weeks  PLANNED INTERVENTIONS: Therapeutic exercises, Therapeutic activity, Neuromuscular re-education, Balance training, Gait training, Patient/Family education, Self Care, Electrical stimulation, Cryotherapy, Moist heat, Vasopneumatic device, Ultrasound, and Manual therapy  PLAN FOR NEXT SESSION: Please progress per protocol.  NMES to right quadriceps.   Solae Norling,CHRIS, PTA 04/23/2022, 3:46 PM

## 2022-04-30 ENCOUNTER — Ambulatory Visit: Payer: 59 | Admitting: *Deleted

## 2022-04-30 ENCOUNTER — Encounter: Payer: Self-pay | Admitting: *Deleted

## 2022-04-30 DIAGNOSIS — M6281 Muscle weakness (generalized): Secondary | ICD-10-CM

## 2022-04-30 DIAGNOSIS — M62838 Other muscle spasm: Secondary | ICD-10-CM | POA: Diagnosis present

## 2022-04-30 DIAGNOSIS — M79651 Pain in right thigh: Secondary | ICD-10-CM | POA: Diagnosis present

## 2022-04-30 NOTE — Therapy (Signed)
OUTPATIENT PHYSICAL THERAPY LOWER EXTREMITY TREATMENT   Patient Name: Rebecca Pearson MRN: [id number] DOB:Feb 15, 1981, 41 y.o., female Today's Date: 04/30/2022   PT End of Session - 04/30/22 1033     Visit Number 5    Number of Visits 12    Date for PT Re-Evaluation 05/27/22    PT Start Time 1030    PT Stop Time 1120    PT Time Calculation (min) 50 min             Past Medical History:  Diagnosis Date   Atypical nevus 07/31/2012   Right Submammary   Biliary colic 66/5993   Gastroesophageal reflux disease    History of colitis    History of migraine headaches    History of palpitations    no current med.   History of thrombocytopenia    only during pregnancy   PONV (postoperative nausea and vomiting)    Past Surgical History:  Procedure Laterality Date   CHOLECYSTECTOMY N/A 02/10/2017   Procedure: LAPAROSCOPIC CHOLECYSTECTOMY;  Surgeon: Rolm Bookbinder, MD;  Location: [place];  Service: General;  Laterality: N/A;   RADIOACTIVE SEED GUIDED EXCISIONAL BREAST BIOPSY Left 05/18/2020   Procedure: LEFT RADIOACTIVE SEED GUIDED EXCISIONAL BREAST BIOPSY;  Surgeon: Rolm Bookbinder, MD;  Location: [place];  Service: General;  Laterality: Left;   SHOULDER ARTHROSCOPY WITH BICEPSTENOTOMY Right 01/30/2015   Procedure: SHOULDER ARTHROSCOPY WITH BICEPS TENOTOMY, OPEN TENODESIS,LABRAL DEBRIDEMENT;  Surgeon: Tania Ade, MD;  Location: [place];  Service: Orthopedics;  Laterality: Right;  Rigth shoulder diagnostic arthroscopy biceps tenotomy, open tenodesis, labral debridement   TONSILLECTOMY  2002   There are no problems to display for this patient.   REFERRING PROVIDER: Orvilla Fus MD  REFERRING DIAG: Right hip impingement syndrome.  THERAPY DIAG:  Pain in right thigh  Other muscle spasm  Muscle weakness (generalized)  Rationale for Evaluation and Treatment: Rehabilitation  ONSET DATE: April  2023.  SUBJECTIVE:   SUBJECTIVE STATEMENT:  MD pleased with status, but wants me to progress as per protocol. He stated pain / soreness will be inconsistent.   PERTINENT HISTORY: Right hip injury in April doing a backward lunge. PAIN:  Are you having pain? Yes: NPRS scale: 3-4/10 Pain location: Right hip. Pain description: Ache, sore. Aggravating factors: Movement. Relieving factors: Meds.  PRECAUTIONS: Other: Please see protocol.  WEIGHT BEARING RESTRICTIONS:  Patient able to full weight bear but currently wbat  with two axillary crutches.  FALLS:  Has patient fallen in last 6 months? No  LIVING ENVIRONMENT: Lives with: lives with their spouse Lives in: House/apartment Has following equipment at home: [place]: Therapist, sports.  PLOF: Independent  PATIENT GOALS: Get back to normal.  NEXT MD VISIT:   OBJECTIVE:   Today's Tx:                                                                           04-30-22   Surgery 10-27    6 weeks 04-26-22 see protocol  EXERCISE LOG  Exercise Repetitions and Resistance Comments  Rocker board  Calf stretching and balance   TM 1 MPH  x 5 mins   Seated Hip ADD ball squeeze X 15 hold 5 secs   Seated Hip ABD  isometrics Red band X15 hold 5 secs    Hip ext isometrics  Hooklying position x 10 hold 5 secs   bridge    Bent knee fallouts X10    Prone hip IR/ER 2x10 each way   Prone HS curl 2x10   Hooklying piriformis  stretch X 3 mins   Hip flexion isometrics at 90 degrees X 6 hold 6     Blank cell = exercise not performed today       ASSESSMENT:  CLINICAL IMPRESSION:  Pt arrived today doing fairly well with RT hip and reports that MD is pleased with current status, but wants her to progress as per protocol. Several exs added today and she was able to walk on TM to practice heel/ toe gait pattern.   GOALS: LONG TERM GOALS: Target date: 06/25/2022   Ind with an advanced HEP per  protocol. Baseline:  Goal status: INITIAL  2.  Restore normal right hip range of motion. Baseline:  Goal status: INITIAL  3.  Restore normal right LE strength to increase stability for functional tasks. Baseline:  Goal status: INITIAL  4.  Patient perform ADL's with right hip pain not > 2/10. Baseline:  Goal status: INITIAL  5.  Resume pre-injury activities in accord with protocol guidelines. Baseline:  Goal status: INITIAL  PLAN:  PT FREQUENCY: 2x/week  PT DURATION: 6 weeks  PLANNED INTERVENTIONS: Therapeutic exercises, Therapeutic activity, Neuromuscular re-education, Balance training, Gait training, Patient/Family education, Self Care, Electrical stimulation, Cryotherapy, Moist heat, Vasopneumatic device, Ultrasound, and Manual therapy  PLAN FOR NEXT SESSION: Please progress per protocol.  NMES to right quadriceps.   Jericho Cieslik,CHRIS, PTA 04/30/2022, 2:15 PM

## 2022-05-02 ENCOUNTER — Encounter: Payer: Self-pay | Admitting: *Deleted

## 2022-05-02 ENCOUNTER — Ambulatory Visit: Payer: 59 | Admitting: *Deleted

## 2022-05-02 DIAGNOSIS — M62838 Other muscle spasm: Secondary | ICD-10-CM

## 2022-05-02 DIAGNOSIS — M79651 Pain in right thigh: Secondary | ICD-10-CM

## 2022-05-02 DIAGNOSIS — M6281 Muscle weakness (generalized): Secondary | ICD-10-CM

## 2022-05-02 NOTE — Therapy (Signed)
OUTPATIENT PHYSICAL THERAPY LOWER EXTREMITY TREATMENT   Patient Name: Rebecca Pearson MRN: [id number] DOB:December 02, 1980, 41 y.o., female Today's Date: 05/02/2022   PT End of Session - 05/02/22 1039     Visit Number 6    Number of Visits 12    Date for PT Re-Evaluation 05/27/22    PT Start Time 1030    PT Stop Time 1121    PT Time Calculation (min) 51 min             Past Medical History:  Diagnosis Date   Atypical nevus 07/31/2012   Right Submammary   Biliary colic 59/5638   Gastroesophageal reflux disease    History of colitis    History of migraine headaches    History of palpitations    no current med.   History of thrombocytopenia    only during pregnancy   PONV (postoperative nausea and vomiting)    Past Surgical History:  Procedure Laterality Date   CHOLECYSTECTOMY N/A 02/10/2017   Procedure: LAPAROSCOPIC CHOLECYSTECTOMY;  Surgeon: Rolm Bookbinder, MD;  Location: [place];  Service: General;  Laterality: N/A;   RADIOACTIVE SEED GUIDED EXCISIONAL BREAST BIOPSY Left 05/18/2020   Procedure: LEFT RADIOACTIVE SEED GUIDED EXCISIONAL BREAST BIOPSY;  Surgeon: Rolm Bookbinder, MD;  Location: [place];  Service: General;  Laterality: Left;   SHOULDER ARTHROSCOPY WITH BICEPSTENOTOMY Right 01/30/2015   Procedure: SHOULDER ARTHROSCOPY WITH BICEPS TENOTOMY, OPEN TENODESIS,LABRAL DEBRIDEMENT;  Surgeon: Tania Ade, MD;  Location: [place];  Service: Orthopedics;  Laterality: Right;  Rigth shoulder diagnostic arthroscopy biceps tenotomy, open tenodesis, labral debridement   TONSILLECTOMY  2002   There are no problems to display for this patient.   REFERRING PROVIDER: Orvilla Fus MD  REFERRING DIAG: Right hip impingement syndrome.  THERAPY DIAG:  Pain in right thigh  Other muscle spasm  Muscle weakness (generalized)  Rationale for Evaluation and Treatment: Rehabilitation  ONSET DATE: April  2023.  SUBJECTIVE:   SUBJECTIVE STATEMENT:  Sore after last Rx, but doing okay    PERTINENT HISTORY: Right hip injury in April doing a backward lunge. PAIN:  Are you having pain? Yes: NPRS scale: 3-4/10 Pain location: Right hip. Pain description: Ache, sore. Aggravating factors: Movement. Relieving factors: Meds.  PRECAUTIONS: Other: Please see protocol.  WEIGHT BEARING RESTRICTIONS:  Patient able to full weight bear but currently wbat  with two axillary crutches.  FALLS:  Has patient fallen in last 6 months? 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  PLOF: Independent  PATIENT GOALS: Get back to normal.  NEXT MD VISIT:   OBJECTIVE:   Today's Tx:                                                                           05-02-22   Surgery 10-27    6 weeks 04-26-22 see protocol                                    EXERCISE LOG  Exercise Repetitions and Resistance Comments  Rocker board  Calf stretching and balance   TM 1 MPH  x 5 mins   Side stepping in // bars x5   LAQ's 2# x15   Seated Hip ADD ball squeeze X 15 hold 5 secs   Seated Hip ABD  isometrics Red band X15 hold 5 secs    Hip ext isometrics  Hooklying position x 10 hold 5 secs   bridge x10   Bent knee fallouts X10    Prone hip IR/ER 2x10 each way   Prone HS curl 2x10   Hooklying piriformis  stretch X 3 mins   Hip flexion isometrics at 90 degrees X 6 hold 6    Stool IR/ER     Blank cell = exercise not performed today       ASSESSMENT:  CLINICAL IMPRESSION:  Pt arrived today doing fairly well with RT hip and was able to tolerate/perform new added therex as per protocol. Muscle  shaking and control noted throughout exercises. Pt very challenged with standing side stepping as well as 2in block toe touches   GOALS: LONG TERM GOALS: Target date: 06/27/2022   Ind with an advanced HEP per  protocol. Baseline:  Goal status: Partially Met  2.  Restore normal right hip range of motion. Baseline:  Goal status: On-going  3.  Restore normal right LE strength to increase stability for functional tasks. Baseline:  Goal status: On-going  4.  Patient perform ADL's with right hip pain not > 2/10. Baseline:  Goal status: On-going  5.  Resume pre-injury activities in accord with protocol guidelines. Baseline:  Goal status: On-going  PLAN:  PT FREQUENCY: 2x/week  PT DURATION: 6 weeks  PLANNED INTERVENTIONS: Therapeutic exercises, Therapeutic activity, Neuromuscular re-education, Balance training, Gait training, Patient/Family education, Self Care, Electrical stimulation, Cryotherapy, Moist heat, Vasopneumatic device, Ultrasound, and Manual therapy  PLAN FOR NEXT SESSION: Please progress per protocol.  NMES to right quadriceps.   Justinian Miano,CHRIS, PTA 05/02/2022, 1:12 PM

## 2022-05-07 ENCOUNTER — Ambulatory Visit: Payer: 59 | Admitting: *Deleted

## 2022-05-07 ENCOUNTER — Encounter: Payer: Self-pay | Admitting: *Deleted

## 2022-05-07 DIAGNOSIS — M79651 Pain in right thigh: Secondary | ICD-10-CM | POA: Diagnosis not present

## 2022-05-07 DIAGNOSIS — M62838 Other muscle spasm: Secondary | ICD-10-CM

## 2022-05-07 DIAGNOSIS — M6281 Muscle weakness (generalized): Secondary | ICD-10-CM

## 2022-05-07 NOTE — Therapy (Signed)
OUTPATIENT PHYSICAL THERAPY LOWER EXTREMITY TREATMENT   Patient Name: Rebecca Pearson MRN: [id number] DOB:05/16/1981, 41 y.o., female Today's Date: 05/07/2022   PT End of Session - 05/07/22 1023     Visit Number 7    Number of Visits 12    Date for PT Re-Evaluation 05/27/22    PT Start Time 1030    PT Stop Time 1120    PT Time Calculation (min) 50 min             Past Medical History:  Diagnosis Date   Atypical nevus 07/31/2012   Right Submammary   Biliary colic 01/2017   Gastroesophageal reflux disease    History of colitis    History of migraine headaches    History of palpitations    no current med.   History of thrombocytopenia    only during pregnancy   PONV (postoperative nausea and vomiting)    Past Surgical History:  Procedure Laterality Date   CHOLECYSTECTOMY N/A 02/10/2017   Procedure: LAPAROSCOPIC CHOLECYSTECTOMY;  Surgeon: Wakefield, Matthew, MD;  Location: [facility];  Service: General;  Laterality: N/A;   RADIOACTIVE SEED GUIDED EXCISIONAL BREAST BIOPSY Left 05/18/2020   Procedure: LEFT RADIOACTIVE SEED GUIDED EXCISIONAL BREAST BIOPSY;  Surgeon: Wakefield, Matthew, MD;  Location: [facility];  Service: General;  Laterality: Left;   SHOULDER ARTHROSCOPY WITH BICEPSTENOTOMY Right 01/30/2015   Procedure: SHOULDER ARTHROSCOPY WITH BICEPS TENOTOMY, OPEN TENODESIS,LABRAL DEBRIDEMENT;  Surgeon: Justin Chandler, MD;  Location: [facility];  Service: Orthopedics;  Laterality: Right;  Rigth shoulder diagnostic arthroscopy biceps tenotomy, open tenodesis, labral debridement   TONSILLECTOMY  2002   There are no problems to display for this patient.   REFERRING PROVIDER: Gordon[place] Weeks MD  REFERRING DIAG: Right hip impingement syndrome.  THERAPY DIAG:  Pain in right thigh  Other muscle spasm  Muscle weakness (generalized)  Rationale for Evaluation and Treatment: Rehabilitation  ONSET DATE: April  2023.  SUBJECTIVE:   SUBJECTIVE STATEMENT:  Sore after last Rx, but doing okay    PERTINENT HISTORY: Right hip injury in April doing a backward lunge. PAIN:  Are you having pain? Yes: NPRS scale: 3-4/10 Pain location: Right hip. Pain description: Ache, sore. Aggravating factors: Movement. Relieving factors: Meds.  PRECAUTIONS: Other: Please see protocol.  WEIGHT BEARING RESTRICTIONS:  Patient able to full weight bear but currently wbat  with two axillary crutches.  FALLS:  Has patient fallen in last 6 months? No  LIVING ENVIRONMENT: Lives with: lives with their spouse Lives in: House/apartment Has following equipment at home: Crutches  OCCUPATION: RN.  PLOF: Independent  PATIENT GOALS: Get back to normal.  NEXT MD VISIT:   OBJECTIVE:            Today's Tx:   05-07-22                                             Surgery 10-27    7 weeks 05-03-22 see protocol                                    EXERCISE LOG  Exercise Repetitions and Resistance Comments  Rocker board  Calf stretching and balance   TM 1.1 MPH  x 5 mins   Side stepping in // bars X8    ,   2in block toe taps x 10   2in block step ups    LAQ's 2# x15 pause at top   Seated Hip ADD ball squeeze X 15 hold 5 secs   Seated Hip ABD  isometrics Red band X15 hold 5 secs    Hip ext isometrics  Hooklying position x 10 hold 5 secs   bridge x10   Bent knee fallouts X10    Prone hip IR/ER 2x10 each way   Prone HS curl 2x10   Seated HS curl Red x10   Hooklying piriformis  stretch    Hip flexion isometrics at 90 degrees X 6 hold 6    Stool IR/ER     Blank cell = exercise not performed today       ASSESSMENT:  CLINICAL IMPRESSION:  Pt arrived today doing fairly well with RT hip and was able to tolerate/perform new added therex as per protocol.Pt did much better today with RT LE control and was able to perform side stepping and 2in box side toe touches with improved control. Step up was also performed today  and did well.  GOALS: LONG TERM GOALS: Target date: 07/02/2022   Ind with an advanced HEP per protocol. Baseline:  Goal status: Partially Met  2.  Restore normal right hip range of motion. Baseline:  Goal status: On-going  3.  Restore normal right LE strength to increase stability for functional tasks. Baseline:  Goal status: On-going  4.  Patient perform ADL's with right hip pain not > 2/10. Baseline:  Goal status: On-going  5.  Resume pre-injury activities in accord with protocol guidelines. Baseline:  Goal status: On-going  PLAN:  PT FREQUENCY: 2x/week  PT DURATION: 6 weeks  PLANNED INTERVENTIONS: Therapeutic exercises, Therapeutic activity, Neuromuscular re-education, Balance training, Gait training, Patient/Family education, Self Care, Electrical stimulation, Cryotherapy, Moist heat, Vasopneumatic device, Ultrasound, and Manual therapy  PLAN FOR NEXT SESSION: Please progress per protocol.  NMES to right quadriceps.   Keny Donald,CHRIS, PTA 05/07/2022, 11:26 AM

## 2022-05-09 ENCOUNTER — Encounter: Payer: Self-pay | Admitting: *Deleted

## 2022-05-09 ENCOUNTER — Ambulatory Visit: Payer: 59 | Admitting: *Deleted

## 2022-05-09 DIAGNOSIS — M79651 Pain in right thigh: Secondary | ICD-10-CM

## 2022-05-09 DIAGNOSIS — M62838 Other muscle spasm: Secondary | ICD-10-CM

## 2022-05-09 DIAGNOSIS — M6281 Muscle weakness (generalized): Secondary | ICD-10-CM

## 2022-05-09 NOTE — Therapy (Signed)
OUTPATIENT PHYSICAL THERAPY LOWER EXTREMITY TREATMENT   Patient Name: Rebecca Pearson MRN: [id number] DOB:02/23/1981, 41 y.o., female Today's Date: 05/09/2022   PT End of Session - 05/09/22 1521     Visit Number 8    Number of Visits 12    Date for PT Re-Evaluation 05/27/22    PT Start Time 6803    PT Stop Time 1605    PT Time Calculation (min) 50 min             Past Medical History:  Diagnosis Date   Atypical nevus 07/31/2012   Right Submammary   Biliary colic 21/2248   Gastroesophageal reflux disease    History of colitis    History of migraine headaches    History of palpitations    no current med.   History of thrombocytopenia    only during pregnancy   PONV (postoperative nausea and vomiting)    Past Surgical History:  Procedure Laterality Date   CHOLECYSTECTOMY N/A 02/10/2017   Procedure: LAPAROSCOPIC CHOLECYSTECTOMY;  Surgeon: Rolm Bookbinder, MD;  Location: [place];  Service: General;  Laterality: N/A;   RADIOACTIVE SEED GUIDED EXCISIONAL BREAST BIOPSY Left 05/18/2020   Procedure: LEFT RADIOACTIVE SEED GUIDED EXCISIONAL BREAST BIOPSY;  Surgeon: Rolm Bookbinder, MD;  Location: [place];  Service: General;  Laterality: Left;   SHOULDER ARTHROSCOPY WITH BICEPSTENOTOMY Right 01/30/2015   Procedure: SHOULDER ARTHROSCOPY WITH BICEPS TENOTOMY, OPEN TENODESIS,LABRAL DEBRIDEMENT;  Surgeon: Tania Ade, MD;  Location: [place];  Service: Orthopedics;  Laterality: Right;  Rigth shoulder diagnostic arthroscopy biceps tenotomy, open tenodesis, labral debridement   TONSILLECTOMY  2002   There are no problems to display for this patient.   REFERRING PROVIDER: Orvilla Fus MD  REFERRING DIAG: Right hip impingement syndrome.  THERAPY DIAG:  Pain in right thigh  Other muscle spasm  Muscle weakness (generalized)  Rationale for Evaluation and Treatment: Rehabilitation  ONSET DATE: April  2023.  SUBJECTIVE:   SUBJECTIVE STATEMENT:  Sore after last Rx, but doing okay    PERTINENT HISTORY: Right hip injury in April doing a backward lunge. PAIN:  Are you having pain? Yes: NPRS scale: 3-4/10 Pain location: Right hip. Pain description: Ache, sore. Aggravating factors: Movement. Relieving factors: Meds.  PRECAUTIONS: Other: Please see protocol.  WEIGHT BEARING RESTRICTIONS:  Patient able to full weight bear but currently wbat  with two axillary crutches.  FALLS:  Has patient fallen in last 6 months? No  LIVING ENVIRONMENT: Lives with: lives with their spouse Lives in: House/apartment Has following equipment at home: [place]: Therapist, sports.  PLOF: Independent  PATIENT GOALS: Get back to normal.  NEXT MD VISIT:   OBJECTIVE:            Today's Tx:   05-09-22                                             Surgery 10-27    7 weeks 05-03-22 see protocol                                    EXERCISE LOG  Exercise Repetitions and Resistance Comments  Rocker board  Calf stretching and balance   TM    Side stepping in // bars X8    ,  2in block step ups    LAQ's 2# x15 pause at top   Seated Hip ADD ball squeeze X 15 hold 5 secs   Seated Hip ABD  isometrics Red band X15 hold 5 secs    Hip ext isometrics     bridge X5   HS cramp   Bent knee fallouts X10    Prone hip IR/ER    Prone HS curl    Seated HS curl    Hooklying piriformis  stretch    Hip flexion isometrics at 90 degrees    Stool IR/ER     Blank cell = exercise not performed today    Manual: Prom for prone quad stretching with quad contract/Relax technique used. Attempted HS contracted/Relax , but unable due to cramping   ASSESSMENT:  CLINICAL IMPRESSION:  Pt arrived today  with increased soreness RT hip due to lots of standing yesterday. Unable to perform as many exs today due to tightness/ soreness anterior aspect. Prone manual quad stretching was performed and tolerated well with decreased  tightness/ soreness end of session.  GOALS: LONG TERM GOALS: Target date: 07/04/2022   Ind with an advanced HEP per protocol. Baseline:  Goal status: Partially Met  2.  Restore normal right hip range of motion. Baseline:  Goal status: On-going  3.  Restore normal right LE strength to increase stability for functional tasks. Baseline:  Goal status: On-going  4.  Patient perform ADL's with right hip pain not > 2/10. Baseline:  Goal status: On-going  5.  Resume pre-injury activities in accord with protocol guidelines. Baseline:  Goal status: On-going  PLAN:  PT FREQUENCY: 2x/week  PT DURATION: 6 weeks  PLANNED INTERVENTIONS: Therapeutic exercises, Therapeutic activity, Neuromuscular re-education, Balance training, Gait training, Patient/Family education, Self Care, Electrical stimulation, Cryotherapy, Moist heat, Vasopneumatic device, Ultrasound, and Manual therapy  PLAN FOR NEXT SESSION: Please progress per protocol.  NMES to right quadriceps.   Sheyli Horwitz,CHRIS, PTA 05/09/2022, 5:30 PM

## 2022-05-15 ENCOUNTER — Ambulatory Visit: Payer: 59 | Admitting: Physical Therapy

## 2022-05-17 ENCOUNTER — Encounter: Payer: 59 | Admitting: *Deleted

## 2022-05-21 ENCOUNTER — Encounter: Payer: 59 | Admitting: *Deleted

## 2022-05-23 ENCOUNTER — Encounter: Payer: Self-pay | Admitting: *Deleted

## 2022-05-23 ENCOUNTER — Ambulatory Visit: Payer: 59 | Attending: Orthopaedic Surgery | Admitting: *Deleted

## 2022-05-23 DIAGNOSIS — M79651 Pain in right thigh: Secondary | ICD-10-CM | POA: Diagnosis present

## 2022-05-23 DIAGNOSIS — M62838 Other muscle spasm: Secondary | ICD-10-CM | POA: Diagnosis present

## 2022-05-23 DIAGNOSIS — M6281 Muscle weakness (generalized): Secondary | ICD-10-CM | POA: Insufficient documentation

## 2022-05-23 NOTE — Therapy (Signed)
OUTPATIENT PHYSICAL THERAPY LOWER EXTREMITY TREATMENT   Patient Name: Rebecca Pearson MRN: [id number] DOB:1981/01/18, 42 y.o., female Today's Date: 05/23/2022   PT End of Session - 05/23/22 1047     Visit Number 9    Number of Visits 12    Date for PT Re-Evaluation 05/27/22    PT Start Time 1030    PT Stop Time 1125    PT Time Calculation (min) 55 min             Past Medical History:  Diagnosis Date   Atypical nevus 07/31/2012   Right Submammary   Biliary colic 59/5638   Gastroesophageal reflux disease    History of colitis    History of migraine headaches    History of palpitations    no current med.   History of thrombocytopenia    only during pregnancy   PONV (postoperative nausea and vomiting)    Past Surgical History:  Procedure Laterality Date   CHOLECYSTECTOMY N/A 02/10/2017   Procedure: LAPAROSCOPIC CHOLECYSTECTOMY;  Surgeon: Rolm Bookbinder, MD;  Location: [place];  Service: General;  Laterality: N/A;   RADIOACTIVE SEED GUIDED EXCISIONAL BREAST BIOPSY Left 05/18/2020   Procedure: LEFT RADIOACTIVE SEED GUIDED EXCISIONAL BREAST BIOPSY;  Surgeon: Rolm Bookbinder, MD;  Location: [place];  Service: General;  Laterality: Left;   SHOULDER ARTHROSCOPY WITH BICEPSTENOTOMY Right 01/30/2015   Procedure: SHOULDER ARTHROSCOPY WITH BICEPS TENOTOMY, OPEN TENODESIS,LABRAL DEBRIDEMENT;  Surgeon: Tania Ade, MD;  Location: [place];  Service: Orthopedics;  Laterality: Right;  Rigth shoulder diagnostic arthroscopy biceps tenotomy, open tenodesis, labral debridement   TONSILLECTOMY  2002   There are no problems to display for this patient.   REFERRING PROVIDER: Orvilla Fus MD  REFERRING DIAG: Right hip impingement syndrome.  THERAPY DIAG:  Pain in right thigh  Muscle weakness (generalized)  Other muscle spasm  Rationale for Evaluation and Treatment: Rehabilitation  ONSET DATE: April 2023.  SUBJECTIVE:    SUBJECTIVE STATEMENT:  Sore after last Rx, but doing okay    PERTINENT HISTORY: Right hip injury in April doing a backward lunge. PAIN:  Are you having pain? Yes: NPRS scale: 3-4/10 Pain location: Right hip. Pain description: Ache, sore. Aggravating factors: Movement. Relieving factors: Meds.  PRECAUTIONS: Other: Please see protocol.  WEIGHT BEARING RESTRICTIONS:  Patient able to full weight bear but currently wbat  with two axillary crutches.  FALLS:  Has patient fallen in last 6 months? No  LIVING ENVIRONMENT: Lives with: lives with their spouse Lives in: House/apartment Has following equipment at home: [place]: Therapist, sports.  PLOF: Independent  PATIENT GOALS: Get back to normal.  NEXT MD VISIT:   OBJECTIVE:            Today's Tx:   05-23-22                                             Surgery 10-27    10 weeks 05-24-22   see protocol                                    EXERCISE LOG  Exercise Repetitions and Resistance Comments  Rocker board  Calf stretching and balance   TM 1.3 MPH x 7 mins   Side stepping in // bars  with yellow tband x2 mins ,   2in block step ups    LAQ's 3# x20 pause at top   Seated Hip ADD ball squeeze X 15 hold 5 secs   Seated Hip ABD  isometrics Red band    Hip ext isometrics     bridge X5   HS cramp   Bent knee fallouts X10    Prone hip IR/ER    Prone HS curl    Seated HS curl    Hooklying piriformis  stretch X5 hold 30 secs   Hip flexion isometrics at 90 degrees    Stool IR/ER     Blank cell = exercise not performed today    Manual: STW/ TPR  to quads RT side with focus on Rectus Femoris f/b quad and hip stretching in LT sidelying RT hip isometrics for Add/ABD  ASSESSMENT:  CLINICAL IMPRESSION:  Pt arrived today  with increased soreness RT hip due to Unable to perform as many exs today due to tightness/ soreness anterior aspect. Prone manual quad stretching was performed and tolerated well with decreased tightness/  soreness end of session.  GOALS: LONG TERM GOALS: Target date: 07/18/2022   Ind with an advanced HEP per protocol. Baseline:  Goal status: Partially Met  2.  Restore normal right hip range of motion. Baseline:  Goal status: On-going  3.  Restore normal right LE strength to increase stability for functional tasks. Baseline:  Goal status: On-going  4.  Patient perform ADL's with right hip pain not > 2/10. Baseline:  Goal status: On-going  5.  Resume pre-injury activities in accord with protocol guidelines. Baseline:  Goal status: On-going  PLAN:  PT FREQUENCY: 2x/week  PT DURATION: 6 weeks  PLANNED INTERVENTIONS: Therapeutic exercises, Therapeutic activity, Neuromuscular re-education, Balance training, Gait training, Patient/Family education, Self Care, Electrical stimulation, Cryotherapy, Moist heat, Vasopneumatic device, Ultrasound, and Manual therapy  PLAN FOR NEXT SESSION: Please progress per protocol.  NMES to right quadriceps.   Adelbert Gaspard,CHRIS, PTA 05/23/2022, 11:30 AM

## 2022-05-28 ENCOUNTER — Ambulatory Visit: Payer: 59 | Admitting: *Deleted

## 2022-05-28 DIAGNOSIS — M62838 Other muscle spasm: Secondary | ICD-10-CM

## 2022-05-28 DIAGNOSIS — M79651 Pain in right thigh: Secondary | ICD-10-CM | POA: Diagnosis not present

## 2022-05-28 DIAGNOSIS — M6281 Muscle weakness (generalized): Secondary | ICD-10-CM

## 2022-05-28 NOTE — Therapy (Signed)
OUTPATIENT PHYSICAL THERAPY LOWER EXTREMITY TREATMENT   Patient Name: Rebecca Pearson MRN: [id number] DOB:04/29/1981, 42 y.o., female Today's Date: 05/28/2022   PT End of Session - 05/28/22 1044     Visit Number 10    Number of Visits 12    Date for PT Re-Evaluation 05/27/22    PT Start Time 1031    PT Stop Time 1121    PT Time Calculation (min) 50 min             Past Medical History:  Diagnosis Date   Atypical nevus 07/31/2012   Right Submammary   Biliary colic 57/8469   Gastroesophageal reflux disease    History of colitis    History of migraine headaches    History of palpitations    no current med.   History of thrombocytopenia    only during pregnancy   PONV (postoperative nausea and vomiting)    Past Surgical History:  Procedure Laterality Date   CHOLECYSTECTOMY N/A 02/10/2017   Procedure: LAPAROSCOPIC CHOLECYSTECTOMY;  Surgeon: Rolm Bookbinder, MD;  Location: [place];  Service: General;  Laterality: N/A;   RADIOACTIVE SEED GUIDED EXCISIONAL BREAST BIOPSY Left 05/18/2020   Procedure: LEFT RADIOACTIVE SEED GUIDED EXCISIONAL BREAST BIOPSY;  Surgeon: Rolm Bookbinder, MD;  Location: [place];  Service: General;  Laterality: Left;   SHOULDER ARTHROSCOPY WITH BICEPSTENOTOMY Right 01/30/2015   Procedure: SHOULDER ARTHROSCOPY WITH BICEPS TENOTOMY, OPEN TENODESIS,LABRAL DEBRIDEMENT;  Surgeon: Tania Ade, MD;  Location: [place];  Service: Orthopedics;  Laterality: Right;  Rigth shoulder diagnostic arthroscopy biceps tenotomy, open tenodesis, labral debridement   TONSILLECTOMY  2002   There are no problems to display for this patient.   REFERRING PROVIDER: Orvilla Fus MD  REFERRING DIAG: Right hip impingement syndrome.  THERAPY DIAG:  Pain in right thigh  Muscle weakness (generalized)  Other muscle spasm  Rationale for Evaluation and Treatment: Rehabilitation  ONSET DATE: April 2023.  SUBJECTIVE:    SUBJECTIVE STATEMENT:  Sore for the last few days after driving on Sunday. I had sharp pains in the glute area    PERTINENT HISTORY: Right hip injury in April doing a backward lunge. PAIN:  Are you having pain? Yes: NPRS scale: 3-7/10 Pain location: Right hip. Pain description: Ache, sore. Aggravating factors: Movement. Relieving factors: Meds.  PRECAUTIONS: Other: Please see protocol.  WEIGHT BEARING RESTRICTIONS:  Patient able to full weight bear but currently wbat  with two axillary crutches.  FALLS:  Has patient fallen in last 6 months? No  LIVING ENVIRONMENT: Lives with: lives with their spouse Lives in: House/apartment Has following equipment at home: [place]: Therapist, sports.  PLOF: Independent  PATIENT GOALS: Get back to normal.  NEXT MD VISIT:   OBJECTIVE:            Today's Tx:   05-28-22                                             Surgery 10-27    10 weeks 05-24-22   see protocol                                    EXERCISE LOG  Exercise Repetitions and Resistance Comments  Rocker board  Calf stretching and balance   TM 1.3  MPH x 7 mins   Side stepping in // bars    with yellow tband x3 mins ,   4in block step ups  Groin pain  6in side step and forward  toe touch 2x10 each Groin pain  LAQ's 3# x20 pause at top   Seated Hip ADD ball squeeze X 15 hold 5 secs   Seated Hip ABD  isometrics Red band    Hip ext isometrics     bridge x10   Bent knee fallouts    Prone hip IR/ER    Prone HS curl    Seated HS curl    Hooklying piriformis  stretch X5 hold 30 secs   Hip flexion isometrics at 90 degrees    Stool IR/ER  X 10 each way    Blank cell = exercise not performed today    Manual:Rectus femoris  quad and hip stretching in LT sidelying   ASSESSMENT:  CLINICAL IMPRESSION:  Pt arrived today  with increased soreness RT hip due to driving some the other day. Pt did great with therex today and was able to perform standing hip stool ER/IR. LT side lying   manual quad and hip  stretching was performed and tolerated well with decreased tightness/ soreness end of session.  GOALS: LONG TERM GOALS: Target date: 07/23/2022   Ind with an advanced HEP per protocol. Baseline:  Goal status: Partially Met  2.  Restore normal right hip range of motion. Baseline:  Goal status: On-going  3.  Restore normal right LE strength to increase stability for functional tasks. Baseline:  Goal status: On-going  4.  Patient perform ADL's with right hip pain not > 2/10. Baseline:  Goal status: On-going  5.  Resume pre-injury activities in accord with protocol guidelines. Baseline:  Goal status: On-going  PLAN:  PT FREQUENCY: 2x/week  PT DURATION: 6 weeks  PLANNED INTERVENTIONS: Therapeutic exercises, Therapeutic activity, Neuromuscular re-education, Balance training, Gait training, Patient/Family education, Self Care, Electrical stimulation, Cryotherapy, Moist heat, Vasopneumatic device, Ultrasound, and Manual therapy  PLAN FOR NEXT SESSION: Please progress per protocol.    Diana Davenport,CHRIS, PTA 05/28/2022, 4:56 PM

## 2022-06-04 ENCOUNTER — Ambulatory Visit: Payer: 59 | Admitting: *Deleted

## 2022-06-04 ENCOUNTER — Encounter: Payer: Self-pay | Admitting: *Deleted

## 2022-06-04 DIAGNOSIS — M62838 Other muscle spasm: Secondary | ICD-10-CM

## 2022-06-04 DIAGNOSIS — M6281 Muscle weakness (generalized): Secondary | ICD-10-CM

## 2022-06-04 DIAGNOSIS — M79651 Pain in right thigh: Secondary | ICD-10-CM

## 2022-06-04 NOTE — Therapy (Signed)
OUTPATIENT PHYSICAL THERAPY LOWER EXTREMITY TREATMENT   Patient Name: Rebecca Pearson MRN: [id number] DOB:06/15/1980, 42 y.o., female Today's Date: 06/04/2022   PT End of Session - 06/04/22 1037     Visit Number 11    Number of Visits 12    Date for PT Re-Evaluation 05/27/22    PT Start Time 1030    PT Stop Time 1120    PT Time Calculation (min) 50 min             Past Medical History:  Diagnosis Date   Atypical nevus 07/31/2012   Right Submammary   Biliary colic 12/6759   Gastroesophageal reflux disease    History of colitis    History of migraine headaches    History of palpitations    no current med.   History of thrombocytopenia    only during pregnancy   PONV (postoperative nausea and vomiting)    Past Surgical History:  Procedure Laterality Date   CHOLECYSTECTOMY N/A 02/10/2017   Procedure: LAPAROSCOPIC CHOLECYSTECTOMY;  Surgeon: Rolm Bookbinder, MD;  Location: [facility];  Service: General;  Laterality: N/A;   RADIOACTIVE SEED GUIDED EXCISIONAL BREAST BIOPSY Left 05/18/2020   Procedure: LEFT RADIOACTIVE SEED GUIDED EXCISIONAL BREAST BIOPSY;  Surgeon: Rolm Bookbinder, MD;  Location: [facility];  Service: General;  Laterality: Left;   SHOULDER ARTHROSCOPY WITH BICEPSTENOTOMY Right 01/30/2015   Procedure: SHOULDER ARTHROSCOPY WITH BICEPS TENOTOMY, OPEN TENODESIS,LABRAL DEBRIDEMENT;  Surgeon: Tania Ade, MD;  Location: [facility];  Service: Orthopedics;  Laterality: Right;  Rigth shoulder diagnostic arthroscopy biceps tenotomy, open tenodesis, labral debridement   TONSILLECTOMY  2002   There are no problems to display for this patient.   REFERRING PROVIDER: Orvilla Fus MD  REFERRING DIAG: Right hip impingement syndrome.  THERAPY DIAG:  Pain in right thigh  Muscle weakness (generalized)  Other muscle spasm  Rationale for Evaluation and Treatment: Rehabilitation  ONSET DATE: April  2023.  SUBJECTIVE:   SUBJECTIVE STATEMENT:  Sore today 2-5/10. RT hip. To MD Friday    PERTINENT HISTORY: Right hip injury in April doing a backward lunge. PAIN:  Are you having pain? Yes: NPRS scale: 3-7/10 Pain location: Right hip. Pain description: Ache, sore. Aggravating factors: Movement. Relieving factors: Meds.  PRECAUTIONS: Other: Please see protocol.  WEIGHT BEARING RESTRICTIONS:  Patient able to full weight bear but currently wbat  with two axillary crutches.  FALLS:  Has patient fallen in last 6 months? No  LIVING ENVIRONMENT: Lives with: lives with their spouse Lives in: House/apartment Has following equipment at home: [place]: Therapist, sports.  PLOF: Independent  PATIENT GOALS: Get back to normal.  NEXT MD VISIT:   OBJECTIVE:            Today's Tx:   06-04-22                                             Surgery 10-27    11 weeks 05-31-22   see protocol                                    EXERCISE LOG  Exercise Repetitions and Resistance Comments  Rocker board  Calf stretching and balance x 3 mins   TM 1.5 MPH x 8 mins   Side  stepping in // bars    with yellow tband x3 mins ,   4in block step ups  Groin pain  6in side step and forward  toe touch 2x10 each Groin pain  LAQ's 4# x20 pause at top   Seated Hip ADD ball squeeze    Seated Hip ABD  isometrics Red band    Hip ext isometrics     bridge    Bent knee fallouts    Prone hip IR/ER    Prone HS curl    Seated HS curl    Hooklying piriformis  stretch    Hip flexion isometrics at 90 degrees    Stool IR/ER  X 10 each way   Assisted SLR x10   Side-lying Bent leg raise x10   Side-lying Clam shell x10   Side-lying IR X10  with 2 pillows between knees    Blank cell = exercise not performed today    Manual:   Rectus femoris  quad and hip stretching in LT sidelying   ASSESSMENT:  CLINICAL IMPRESSION:  Pt arrived today doing about the same with RT hip soreness. She has progressed on on levels  with moving RT LE and improved gait cycle.She continues to have anterior hip discomfort with prolonged sitting as well as trying to lift RT leg at times. Rx again focused on RT LE strengthening and ROM.   LT side lying  manual quad and hip  stretching was performed and tolerated well with decreased tightness/ soreness end of session.  GOALS: LONG TERM GOALS: Target date: 07/30/2022   Ind with an advanced HEP per protocol. Baseline:  Goal status: Partially Met  2.  Restore normal right hip range of motion. Baseline:  Goal status: On-going  3.  Restore normal right LE strength to increase stability for functional tasks. Baseline:  Goal status: On-going  4.  Patient perform ADL's with right hip pain not > 2/10. Baseline:  Goal status: On-going  5.  Resume pre-injury activities in accord with protocol guidelines. Baseline:  Goal status: On-going  PLAN:  PT FREQUENCY: 2x/week  PT DURATION: 6 weeks  PLANNED INTERVENTIONS: Therapeutic exercises, Therapeutic activity, Neuromuscular re-education, Balance training, Gait training, Patient/Family education, Self Care, Electrical stimulation, Cryotherapy, Moist heat, Vasopneumatic device, Ultrasound, and Manual therapy  PLAN FOR NEXT SESSION: Please progress per protocol.    Sharonna Vinje,CHRIS, PTA 06/04/2022, 4:56 PM

## 2022-06-07 DIAGNOSIS — R197 Diarrhea, unspecified: Secondary | ICD-10-CM | POA: Insufficient documentation

## 2022-06-11 ENCOUNTER — Ambulatory Visit: Payer: 59 | Admitting: *Deleted

## 2022-06-11 ENCOUNTER — Encounter: Payer: Self-pay | Admitting: *Deleted

## 2022-06-11 DIAGNOSIS — M62838 Other muscle spasm: Secondary | ICD-10-CM

## 2022-06-11 DIAGNOSIS — M6281 Muscle weakness (generalized): Secondary | ICD-10-CM

## 2022-06-11 DIAGNOSIS — M79651 Pain in right thigh: Secondary | ICD-10-CM | POA: Diagnosis not present

## 2022-06-11 NOTE — Therapy (Addendum)
OUTPATIENT PHYSICAL THERAPY LOWER EXTREMITY TREATMENT   Patient Name: Rebecca Pearson MRN: [id number] DOB:December 27, 1980, 42 y.o., female Today's Date: 06/11/2022   PT End of Session - 06/11/22 1045     Visit Number 12    Number of Visits 18    Date for PT Re-Evaluation 07/01/22    PT Start Time 1034    PT Stop Time 1120    PT Time Calculation (min) 46 min             Past Medical History:  Diagnosis Date   Atypical nevus 07/31/2012   Right Submammary   Biliary colic 62/8315   Gastroesophageal reflux disease    History of colitis    History of migraine headaches    History of palpitations    no current med.   History of thrombocytopenia    only during pregnancy   PONV (postoperative nausea and vomiting)    Past Surgical History:  Procedure Laterality Date   CHOLECYSTECTOMY N/A 02/10/2017   Procedure: LAPAROSCOPIC CHOLECYSTECTOMY;  Surgeon: Rolm Bookbinder, MD;  Location: [facility];  Service: General;  Laterality: N/A;   RADIOACTIVE SEED GUIDED EXCISIONAL BREAST BIOPSY Left 05/18/2020   Procedure: LEFT RADIOACTIVE SEED GUIDED EXCISIONAL BREAST BIOPSY;  Surgeon: Rolm Bookbinder, MD;  Location: [facility];  Service: General;  Laterality: Left;   SHOULDER ARTHROSCOPY WITH BICEPSTENOTOMY Right 01/30/2015   Procedure: SHOULDER ARTHROSCOPY WITH BICEPS TENOTOMY, OPEN TENODESIS,LABRAL DEBRIDEMENT;  Surgeon: Tania Ade, MD;  Location: [facility];  Service: Orthopedics;  Laterality: Right;  Rigth shoulder diagnostic arthroscopy biceps tenotomy, open tenodesis, labral debridement   TONSILLECTOMY  2002   There are no problems to display for this patient.   REFERRING PROVIDER: Orvilla Fus MD  REFERRING DIAG: Right hip impingement syndrome.  THERAPY DIAG:  Pain in right thigh - Plan: PT plan of care cert/re-cert  Muscle weakness (generalized) - Plan: PT plan of care cert/re-cert  Other muscle spasm - Plan: PT plan of care  cert/re-cert  Rationale for Evaluation and Treatment: Rehabilitation  ONSET DATE: April 2023.  SUBJECTIVE:   SUBJECTIVE STATEMENT:  Sore today 2-5/10. RT hip. MD was pleased with status and wanted  to cont. PT for strengthening. To MD 8 weeks    PERTINENT HISTORY: Right hip injury in April doing a backward lunge. PAIN:  Are you having pain? Yes: NPRS scale: 3-7/10 Pain location: Right hip. Pain description: Ache, sore. Aggravating factors: Movement. Relieving factors: Meds.  PRECAUTIONS: Other: Please see protocol.  WEIGHT BEARING RESTRICTIONS:  Patient able to full weight bear but currently wbat  with two axillary crutches.  FALLS:  Has patient fallen in last 6 months? No  LIVING ENVIRONMENT: Lives with: lives with their spouse Lives in: House/apartment Has following equipment at home: [place]: Therapist, sports.  PLOF: Independent  PATIENT GOALS: Get back to normal.  NEXT MD VISIT:   OBJECTIVE:            Today's Tx:   06-11-22                                             Surgery 10-27    11 weeks 05-31-22   see protocol  EXERCISE LOG  Exercise Repetitions and Resistance Comments  Rocker board  Calf stretching and balance x 3 mins   TM 1.7 MPH x 8 mins   Side stepping in // bars    with yellow tband x3 mins ,   4in block step ups  Groin pain  6in side step and forward  toe touch 2x10 each and step up x 10 with UE assist Groin pain  LAQ's    Seated Hip ADD ball squeeze    Seated Hip ABD  isometrics Red band    Hip ext isometrics     bridge    Bent knee fallouts    Prone hip IR/ER    Prone HS curl    Seated HS curl    Hooklying piriformis  stretch    Hip flexion isometrics at 90 degrees    Supine leg of mat hip stretch x3   Stool IR/ER  X 10 each way   Assisted SLR x10   Side-lying Bent leg raise x10   Side-lying Clam shell 2x10   Side-lying IR 2X10  with 2 pillows between knees    Blank cell = exercise not performed  today    Manual:   Rectus femoris  quad and hip stretching in LT sidelying   ASSESSMENT:  CLINICAL IMPRESSION:  Pt arrived today and reports MD was pleased and wants her to continue with PT for strengthening. Rx focused on strength, balance and  mm activation. Recert to continue PT      GOALS: LONG TERM GOALS: Target date: 08/06/2022   Ind with an advanced HEP per protocol. Baseline:  Goal status: Partially Met  2.  Restore normal right hip range of motion. Baseline:  Goal status: Partially Met  3.  Restore normal right LE strength to increase stability for functional tasks. Baseline:  Goal status: Partially Met  4.  Patient perform ADL's with right hip pain not > 2/10. Baseline:  Goal status: Partially Met                         5.  Resume pre-injury activities in accord with protocol guidelines. Baseline:  Goal status: On-going  PLAN:  PT FREQUENCY: 2x/week  PT DURATION: 6 weeks  PLANNED INTERVENTIONS: Therapeutic exercises, Therapeutic activity, Neuromuscular re-education, Balance training, Gait training, Patient/Family education, Self Care, Electrical stimulation, Cryotherapy, Moist heat, Vasopneumatic device, Ultrasound, and Manual therapy  PLAN FOR NEXT SESSION: Please progress per protocol.    Monish Haliburton,CHRIS, PTA 06/11/2022, 1:08 PM

## 2022-06-18 ENCOUNTER — Ambulatory Visit: Payer: 59 | Admitting: *Deleted

## 2022-06-18 DIAGNOSIS — M62838 Other muscle spasm: Secondary | ICD-10-CM

## 2022-06-18 DIAGNOSIS — M79651 Pain in right thigh: Secondary | ICD-10-CM | POA: Diagnosis not present

## 2022-06-18 DIAGNOSIS — M6281 Muscle weakness (generalized): Secondary | ICD-10-CM

## 2022-06-18 NOTE — Therapy (Signed)
OUTPATIENT PHYSICAL THERAPY LOWER EXTREMITY TREATMENT   Patient Name: Rebecca Pearson MRN: [id number] DOB:[birth year], 42 y.o., female Today's Date: 06/18/2022   PT End of Session - 06/18/22 1540     Visit Number 13    Number of Visits 18    Date for PT Re-Evaluation 07/01/22    PT Start Time 1030    PT Stop Time 1120    PT Time Calculation (min) 50 min              Past Medical History:  Diagnosis Date   Atypical nevus 07/31/2012   Right Submammary   Biliary colic 58/5277   Gastroesophageal reflux disease    History of colitis    History of migraine headaches    History of palpitations    no current med.   History of thrombocytopenia    only during pregnancy   PONV (postoperative nausea and vomiting)    Past Surgical History:  Procedure Laterality Date   CHOLECYSTECTOMY N/A 02/10/2017   Procedure: LAPAROSCOPIC CHOLECYSTECTOMY;  Surgeon: Rolm Bookbinder, MD;  Location: [facility];  Service: General;  Laterality: N/A;   RADIOACTIVE SEED GUIDED EXCISIONAL BREAST BIOPSY Left 05/18/2020   Procedure: LEFT RADIOACTIVE SEED GUIDED EXCISIONAL BREAST BIOPSY;  Surgeon: Rolm Bookbinder, MD;  Location: [facility];  Service: General;  Laterality: Left;   SHOULDER ARTHROSCOPY WITH BICEPSTENOTOMY Right 01/30/2015   Procedure: SHOULDER ARTHROSCOPY WITH BICEPS TENOTOMY, OPEN TENODESIS,LABRAL DEBRIDEMENT;  Surgeon: Tania Ade, MD;  Location: [facility];  Service: Orthopedics;  Laterality: Right;  Rigth shoulder diagnostic arthroscopy biceps tenotomy, open tenodesis, labral debridement   TONSILLECTOMY  2002   There are no problems to display for this patient.   REFERRING PROVIDER: Orvilla Fus MD  REFERRING DIAG: Right hip impingement syndrome.  THERAPY DIAG:  Pain in right thigh  Muscle weakness (generalized)  Other muscle spasm  Rationale for Evaluation and Treatment: Rehabilitation  ONSET DATE: April  2023.  SUBJECTIVE:   SUBJECTIVE STATEMENT:  Sore today 2-3/10. RT hip. Did more driving  PERTINENT HISTORY: Right hip injury in April doing a backward lunge. PAIN:  Are you having pain? Yes: NPRS scale: 3-7/10 Pain location: Right hip. Pain description: Ache, sore. Aggravating factors: Movement. Relieving factors: Meds.  PRECAUTIONS: Other: Please see protocol.  WEIGHT BEARING RESTRICTIONS:  Patient able to full weight bear but currently wbat  with two axillary crutches.  FALLS:  Has patient fallen in last 6 months? No  LIVING ENVIRONMENT: Lives with: lives with their spouse Lives in: House/apartment Has following equipment at home: [place]: Therapist, sports.  PLOF: Independent  PATIENT GOALS: Get back to normal.  NEXT MD VISIT:   OBJECTIVE:            Today's Tx:   06-18-22                                             Surgery 10-27    12 weeks 06-07-22   see protocol                                    EXERCISE LOG              FOTO  Exercise Repetitions and Resistance Comments  Rocker board  Calf stretching and balance x  3 mins   TM 1.8    MPH x 10 mins   Side stepping in // bars    with red tband x3 mins ,   4in block step ups  Groin pain  6in side step and forward  toe touch 2x10 each and step up x 10 with UE assist Groin pain  LAQ's    Knee ext 10# 3x10   Knee flex 30# 3x10  Be aware of RT HS cramping  Seated Hip ADD ball squeeze    Seated Hip ABD  isometrics Red band    Hip ext isometrics     bridge 2x10   Bent knee fallouts    Prone hip IR/ER    Prone HS curl    Seated HS curl    Hooklying piriformis  stretch    Hip flexion isometrics at 90 degrees    Supine leg of mat hip stretch x3   Stool IR/ER     Assisted SLR 2 x10   Side-lying Bent leg raise    Side-lying Clam shell 2x10   Side-lying Rev. Clam 2X10  with 2 pillows between knees    Blank cell = exercise not performed today    Manual:   Rectus femoris  quad and hip stretching in LT  sidelying   ASSESSMENT:  CLINICAL IMPRESSION:  Pt arrived today and reports doing fairly well with RT hip, but had quite a bit of soreness and fatigue after driving the other day. She was able to continue with LE strengthening and balance as tolerated with fatigue and soreness. FOTO next visit     GOALS: LONG TERM GOALS: Target date: 08/13/2022   Ind with an advanced HEP per protocol. Baseline:  Goal status: Partially Met  2.  Restore normal right hip range of motion. Baseline:  Goal status: Partially Met  3.  Restore normal right LE strength to increase stability for functional tasks. Baseline:  Goal status: Partially Met  4.  Patient perform ADL's with right hip pain not > 2/10. Baseline:  Goal status: Partially Met                         5.  Resume pre-injury activities in accord with protocol guidelines. Baseline:  Goal status: On-going  PLAN:  PT FREQUENCY: 2x/week  PT DURATION: 6 weeks  PLANNED INTERVENTIONS: Therapeutic exercises, Therapeutic activity, Neuromuscular re-education, Balance training, Gait training, Patient/Family education, Self Care, Electrical stimulation, Cryotherapy, Moist heat, Vasopneumatic device, Ultrasound, and Manual therapy  PLAN FOR NEXT SESSION: Please progress per protocol.    Thaddus Mcdowell,CHRIS, PTA 06/18/2022, 3:57 PM

## 2022-06-25 ENCOUNTER — Ambulatory Visit: Payer: 59 | Admitting: *Deleted

## 2022-06-27 ENCOUNTER — Encounter: Payer: Self-pay | Admitting: *Deleted

## 2022-06-27 ENCOUNTER — Ambulatory Visit: Payer: 59 | Attending: Orthopaedic Surgery | Admitting: *Deleted

## 2022-06-27 DIAGNOSIS — M79651 Pain in right thigh: Secondary | ICD-10-CM | POA: Diagnosis present

## 2022-06-27 DIAGNOSIS — M6281 Muscle weakness (generalized): Secondary | ICD-10-CM | POA: Diagnosis present

## 2022-06-27 DIAGNOSIS — M62838 Other muscle spasm: Secondary | ICD-10-CM | POA: Diagnosis present

## 2022-06-27 NOTE — Therapy (Signed)
OUTPATIENT PHYSICAL THERAPY LOWER EXTREMITY TREATMENT   Patient Name: Rebecca Pearson MRN: [id number] DOB:[birth year], 42 y.o., female Today's Date: 06/27/2022   PT End of Session - 06/27/22 0953     Visit Number 14    Number of Visits 18    Date for PT Re-Evaluation 07/01/22    PT Start Time 0945    PT Stop Time 7322    PT Time Calculation (min) 47 min              Past Medical History:  Diagnosis Date   Atypical nevus 07/31/2012   Right Submammary   Biliary colic 06/5425   Gastroesophageal reflux disease    History of colitis    History of migraine headaches    History of palpitations    no current med.   History of thrombocytopenia    only during pregnancy   PONV (postoperative nausea and vomiting)    Past Surgical History:  Procedure Laterality Date   CHOLECYSTECTOMY N/A 02/10/2017   Procedure: LAPAROSCOPIC CHOLECYSTECTOMY;  Surgeon: Rolm Bookbinder, MD;  Location: [facility];  Service: General;  Laterality: N/A;   RADIOACTIVE SEED GUIDED EXCISIONAL BREAST BIOPSY Left 05/18/2020   Procedure: LEFT RADIOACTIVE SEED GUIDED EXCISIONAL BREAST BIOPSY;  Surgeon: Rolm Bookbinder, MD;  Location: [facility];  Service: General;  Laterality: Left;   SHOULDER ARTHROSCOPY WITH BICEPSTENOTOMY Right 01/30/2015   Procedure: SHOULDER ARTHROSCOPY WITH BICEPS TENOTOMY, OPEN TENODESIS,LABRAL DEBRIDEMENT;  Surgeon: Tania Ade, MD;  Location: [facility];  Service: Orthopedics;  Laterality: Right;  Rigth shoulder diagnostic arthroscopy biceps tenotomy, open tenodesis, labral debridement   TONSILLECTOMY  2002   There are no problems to display for this patient.   REFERRING PROVIDER: Orvilla Fus MD  REFERRING DIAG: Right hip impingement syndrome.  THERAPY DIAG:  Pain in right thigh  Muscle weakness (generalized)  Other muscle spasm  Rationale for Evaluation and Treatment: Rehabilitation  ONSET DATE: April  2023.  SUBJECTIVE:   SUBJECTIVE STATEMENT:   It took 2 days to recover last Rx  PERTINENT HISTORY: Right hip injury in April doing a backward lunge. PAIN:  Are you having pain? Yes: NPRS scale: 3-7/10 Pain location: Right hip. Pain description: Ache, sore. Aggravating factors: Movement. Relieving factors: Meds.  PRECAUTIONS: Other: Please see protocol.  WEIGHT BEARING RESTRICTIONS:  Patient able to full weight bear but currently wbat  with two axillary crutches.  FALLS:  Has patient fallen in last 6 months? No  LIVING ENVIRONMENT: Lives with: lives with their spouse Lives in: House/apartment Has following equipment at home: [place]: Therapist, sports.  PLOF: Independent  PATIENT GOALS: Get back to normal.  NEXT MD VISIT:   OBJECTIVE:            Today's Tx:   06-18-22                                           RT hip  Surgery 10-27    12 weeks 06-07-22   see protocol                                    EXERCISE LOG                Exercise Repetitions and Resistance Comments  Rocker board  Calf stretching and  balance x 3 mins   TM 2    MPH x 10 mins   Side stepping in // bars    with red tband x3-4  mins ,   4in block step ups  Groin pain  6in side step and forward  toe touch 2x10 each and step up 2x 10 with UE assist Groin pain  LAQ's    Knee ext 10# 3x10   Knee flex 30# 3x10  Be aware of RT HS cramping  Seated Hip ADD ball squeeze    Seated Hip ABD  isometrics Red band    Hip ext isometrics     bridge    Bent knee fallouts    Prone hip IR/ER    Prone HS curl    Seated HS curl    Hooklying piriformis  stretch    Hip flexion isometrics at 90 degrees    Supine leg of mat hip stretch    Stool IR/ER     Assisted SLR 2 x10   Side-lying Bent leg raise    Side-lying Clam shell 2x10   Side-lying Rev. Clam 2X10  with 2 pillows between knees    Blank cell = exercise not performed today    Manual:   Rectus femoris  quad and hip stretching in LT  sidelying   ASSESSMENT:  CLINICAL IMPRESSION:  FOTO performed Pt arrived today and reports sore for 2 days after last Rx. Rx focused on LE strengthening as well as balance. She was able to repeat most of the exs as last Rx and did well. Notable quad tightness at the beginning of manual stretching, but decreased end of session.    GOALS: LONG TERM GOALS: Target date: 08/22/2022   Ind with an advanced HEP per protocol. Baseline:  Goal status: Partially Met  2.  Restore normal right hip range of motion. Baseline:  Goal status: Partially Met  3.  Restore normal right LE strength to increase stability for functional tasks. Baseline:  Goal status: Partially Met  4.  Patient perform ADL's with right hip pain not > 2/10. Baseline:  Goal status: Partially Met                         5.  Resume pre-injury activities in accord with protocol guidelines. Baseline:  Goal status: On-going  PLAN:  PT FREQUENCY: 2x/week  PT DURATION: 6 weeks  PLANNED INTERVENTIONS: Therapeutic exercises, Therapeutic activity, Neuromuscular re-education, Balance training, Gait training, Patient/Family education, Self Care, Electrical stimulation, Cryotherapy, Moist heat, Vasopneumatic device, Ultrasound, and Manual therapy  PLAN FOR NEXT SESSION: Please progress per protocol.    Demauri Advincula,CHRIS, PTA 06/27/2022, 11:05 AM

## 2022-07-04 ENCOUNTER — Ambulatory Visit: Payer: 59 | Admitting: *Deleted

## 2022-07-05 LAB — HM MAMMOGRAPHY

## 2022-07-09 ENCOUNTER — Ambulatory Visit: Payer: 59 | Admitting: *Deleted

## 2022-07-09 ENCOUNTER — Encounter: Payer: Self-pay | Admitting: *Deleted

## 2022-07-09 DIAGNOSIS — M79651 Pain in right thigh: Secondary | ICD-10-CM | POA: Diagnosis not present

## 2022-07-09 DIAGNOSIS — M62838 Other muscle spasm: Secondary | ICD-10-CM

## 2022-07-09 DIAGNOSIS — M6281 Muscle weakness (generalized): Secondary | ICD-10-CM

## 2022-07-09 NOTE — Therapy (Signed)
OUTPATIENT PHYSICAL THERAPY LOWER EXTREMITY TREATMENT   Patient Name: Rebecca Pearson MRN: [id number] DOB:04-Apr-1981, 42 y.o., female Today's Date: 07/09/2022   PT End of Session - 07/09/22 1119     Number of Visits 18    Date for PT Re-Evaluation 07/01/22    PT Start Time 1115    PT Stop Time 1203    PT Time Calculation (min) 48 min              Past Medical History:  Diagnosis Date   Atypical nevus 07/31/2012   Right Submammary   Biliary colic 0000000   Gastroesophageal reflux disease    History of colitis    History of migraine headaches    History of palpitations    no current med.   History of thrombocytopenia    only during pregnancy   PONV (postoperative nausea and vomiting)    Past Surgical History:  Procedure Laterality Date   CHOLECYSTECTOMY N/A 02/10/2017   Procedure: LAPAROSCOPIC CHOLECYSTECTOMY;  Surgeon: Rolm Bookbinder, MD;  Location: [place];  Service: General;  Laterality: N/A;   RADIOACTIVE SEED GUIDED EXCISIONAL BREAST BIOPSY Left 05/18/2020   Procedure: LEFT RADIOACTIVE SEED GUIDED EXCISIONAL BREAST BIOPSY;  Surgeon: Rolm Bookbinder, MD;  Location: [place];  Service: General;  Laterality: Left;   SHOULDER ARTHROSCOPY WITH BICEPSTENOTOMY Right 01/30/2015   Procedure: SHOULDER ARTHROSCOPY WITH BICEPS TENOTOMY, OPEN TENODESIS,LABRAL DEBRIDEMENT;  Surgeon: Tania Ade, MD;  Location: [place];  Service: Orthopedics;  Laterality: Right;  Rigth shoulder diagnostic arthroscopy biceps tenotomy, open tenodesis, labral debridement   TONSILLECTOMY  2002   There are no problems to display for this patient.   REFERRING PROVIDER: Orvilla Fus MD  REFERRING DIAG: Right hip impingement syndrome.  THERAPY DIAG:  Pain in right thigh  Muscle weakness (generalized)  Other muscle spasm  Rationale for Evaluation and Treatment: Rehabilitation  ONSET DATE: April 2023.  SUBJECTIVE:   SUBJECTIVE  STATEMENT:   Had a lot of pain after walking 3 miles and being sick. Pain today 3/10. To MD 08-02-22  PERTINENT HISTORY: Right hip injury in April doing a backward lunge. PAIN:  Are you having pain? Yes: NPRS scale: 3/10 Pain location: Right hip. Pain description: Ache, sore. Aggravating factors: Movement. Relieving factors: Meds.  PRECAUTIONS: Other: Please see protocol.  WEIGHT BEARING RESTRICTIONS:  Patient able to full weight bear but currently wbat  with two axillary crutches.  FALLS:  Has patient fallen in last 6 months? No  LIVING ENVIRONMENT: Lives with: lives with their spouse Lives in: House/apartment Has following equipment at home: [place]: Therapist, sports.  PLOF: Independent  PATIENT GOALS: Get back to normal.  NEXT MD VISIT:   OBJECTIVE:            Today's Tx:           07-09-22                                      RT hip  Surgery 10-27    12 weeks 06-07-22   see protocol                                    EXERCISE LOG                Exercise Repetitions and Resistance Comments  Rocker board  Calf stretching and balance x 3 mins   TM 2    MPH x 10 mins   Side stepping  with squat in // bars    with red tband x3-4  mins.   6in block step ups 2x10  forward and side Groin pain  6in side step and forward  toe touch  Groin pain  LAQ's    Knee ext 10# 3x10   Knee flex 30# 3x10  Be aware of RT HS cramping  SLS SLS on balance pad x 3 rounds   Seated Hip ADD ball squeeze    Seated Hip ABD  isometrics Red band    Hip ext isometrics     bridge    Bent knee fallouts    Prone hip IR/ER    Prone HS curl    Seated HS curl    Hooklying piriformis  stretch    Hip flexion isometrics at 90 degrees    Supine leg of mat hip stretch    Stool IR/ER     Assisted SLR    Side-lying Bent leg raise    Side-lying Clam shell    Side-lying Rev. Clam     Blank cell = exercise not performed today    Manual:   Rectus femoris  quad and hip stretching in LT  sidelying   ASSESSMENT:  CLINICAL IMPRESSION:   Pt arrived today and reports very sore after having to walk about 3 miles last weekend. Rx focused on LE strengthening as well as balance again. She was able to repeat most of the exs as last Rx and did well. Notable quad tightness at the beginning of manual stretching, but decreased end of session. Pt wants HEP handouts for next Rx and then perform HEP x 1 week and then f/u with PT the next week   GOALS: LONG TERM GOALS: Target date: 09/03/2022   Ind with an advanced HEP per protocol. Baseline:  Goal status: Partially Met  2.  Restore normal right hip range of motion. Baseline:  Goal status: Partially Met  3.  Restore normal right LE strength to increase stability for functional tasks. Baseline:  Goal status: Partially Met  4.  Patient perform ADL's with right hip pain not > 2/10. Baseline:  Goal status: Partially Met                         5.  Resume pre-injury activities in accord with protocol guidelines. Baseline:  Goal status: On-going  PLAN:  PT FREQUENCY: 2x/week  PT DURATION: 6 weeks  PLANNED INTERVENTIONS: Therapeutic exercises, Therapeutic activity, Neuromuscular re-education, Balance training, Gait training, Patient/Family education, Self Care, Electrical stimulation, Cryotherapy, Moist heat, Vasopneumatic device, Ultrasound, and Manual therapy  PLAN FOR NEXT SESSION: Please progress per protocol.    Arieon Corcoran,CHRIS, PTA 07/09/2022, 2:23 PM

## 2022-07-16 ENCOUNTER — Ambulatory Visit: Payer: 59 | Admitting: *Deleted

## 2022-07-16 ENCOUNTER — Encounter: Payer: Self-pay | Admitting: *Deleted

## 2022-07-16 DIAGNOSIS — M79651 Pain in right thigh: Secondary | ICD-10-CM

## 2022-07-16 DIAGNOSIS — M6281 Muscle weakness (generalized): Secondary | ICD-10-CM

## 2022-07-16 DIAGNOSIS — M62838 Other muscle spasm: Secondary | ICD-10-CM

## 2022-07-16 NOTE — Therapy (Signed)
OUTPATIENT PHYSICAL THERAPY LOWER EXTREMITY TREATMENT   Patient Name: Rebecca Pearson MRN: [id number] DOB:Jan 06, 1981, 42 y.o., female Today's Date: 07/16/2022   PT End of Session - 07/16/22 1126     Visit Number 15    Number of Visits 18    Date for PT Re-Evaluation 07/01/22    PT Start Time 1115    PT Stop Time 1205    PT Time Calculation (min) 50 min              Past Medical History:  Diagnosis Date   Atypical nevus 07/31/2012   Right Submammary   Biliary colic 0000000   Gastroesophageal reflux disease    History of colitis    History of migraine headaches    History of palpitations    no current med.   History of thrombocytopenia    only during pregnancy   PONV (postoperative nausea and vomiting)    Past Surgical History:  Procedure Laterality Date   CHOLECYSTECTOMY N/A 02/10/2017   Procedure: LAPAROSCOPIC CHOLECYSTECTOMY;  Surgeon: Rolm Bookbinder, MD;  Location: [facility];  Service: General;  Laterality: N/A;   RADIOACTIVE SEED GUIDED EXCISIONAL BREAST BIOPSY Left 05/18/2020   Procedure: LEFT RADIOACTIVE SEED GUIDED EXCISIONAL BREAST BIOPSY;  Surgeon: Rolm Bookbinder, MD;  Location: [facility];  Service: General;  Laterality: Left;   SHOULDER ARTHROSCOPY WITH BICEPSTENOTOMY Right 01/30/2015   Procedure: SHOULDER ARTHROSCOPY WITH BICEPS TENOTOMY, OPEN TENODESIS,LABRAL DEBRIDEMENT;  Surgeon: Tania Ade, MD;  Location: [place];  Service: Orthopedics;  Laterality: Right;  Rigth shoulder diagnostic arthroscopy biceps tenotomy, open tenodesis, labral debridement   TONSILLECTOMY  2002   There are no problems to display for this patient.   REFERRING PROVIDER: Orvilla Fus MD  REFERRING DIAG: Right hip impingement syndrome.  THERAPY DIAG:  Pain in right thigh  Muscle weakness (generalized)  Other muscle spasm  Rationale for Evaluation and Treatment: Rehabilitation  ONSET DATE: April  2023.  SUBJECTIVE:   SUBJECTIVE STATEMENT:   Very sore the next day after last Rx. Pain was sharp at times in the glute and would come and go.The pain happened again the next night.  Pain today 4/10 To MD 08-02-22 . Able to sit criss-cross apple sauce  PERTINENT HISTORY: Right hip injury in April doing a backward lunge. PAIN:  Are you having pain? Yes: NPRS scale: 3/10 Pain location: Right hip. Pain description: Ache, sore. Aggravating factors: Movement. Relieving factors: Meds.  PRECAUTIONS: Other: Please see protocol.  WEIGHT BEARING RESTRICTIONS:  Patient able to full weight bear but currently wbat  with two axillary crutches.  FALLS:  Has patient fallen in last 6 months? No  LIVING ENVIRONMENT: Lives with: lives with their spouse Lives in: House/apartment Has following equipment at home: [place]: Therapist, sports.  PLOF: Independent  PATIENT GOALS: Get back to normal.  NEXT MD VISIT:   OBJECTIVE:            Today's Tx:           07-16-22                                      RT hip  Surgery 10-27    12 weeks 06-07-22   see protocol  EXERCISE LOG                Exercise Repetitions and Resistance Comments  Rocker board  Calf stretching and balance x 3 mins   TM 2    MPH x 10 mins   Side stepping  with squat in // bars    with red tband x3-4  mins.   6in block step ups 2x10  forward and side   6in side step and forward  toe touch    LAQ's    Knee ext 10# 3x10-15   Knee flex 30# 3x10  Be aware of RT HS cramping  SLS SLS on balance pad x 3 rounds( not today)   Seated Hip ADD ball squeeze    Seated Hip ABD  isometrics Red band    Hip ext isometrics     bridge    Bent knee fallouts    Prone hip IR/ER            Hooklying piriformis  stretch    Hip flexion isometrics at 90 degrees    Supine leg of mat hip stretch    Stool IR/ER     Assisted SLR    Side-lying Bent leg raise    Side-lying Clam shell    Side-lying Rev. Clam      Blank cell = exercise not performed today    Manual:   Rectus femoris  quad and hip stretching in LT sidelying   ASSESSMENT:  CLINICAL IMPRESSION:   Pt arrived today and reports very sore after last Rx and had some shooting pain in her RT glute for a couple days. We had added two new exs last Rx so today SLS was not performed and see how Pt is doing tomorrow. Pt's CC was fatigue end of session and some soreness. She also reports increasing her resistance on her peloton cycle and did well. Pt to try new exs at home this week and assess her pain. GOALS: LONG TERM GOALS: Target date: 09/10/2022   Ind with an advanced HEP per protocol. Baseline:  Goal status: Partially Met  2.  Restore normal right hip range of motion. Baseline:  Goal status: Partially Met  3.  Restore normal right LE strength to increase stability for functional tasks. Baseline:  Goal status: Partially Met  4.  Patient perform ADL's with right hip pain not > 2/10. Baseline:  Goal status: Partially Met                         5.  Resume pre-injury activities in accord with protocol guidelines. Baseline:  Goal status: On-going  PLAN:  PT FREQUENCY: 2x/week  PT DURATION: 6 weeks  PLANNED INTERVENTIONS: Therapeutic exercises, Therapeutic activity, Neuromuscular re-education, Balance training, Gait training, Patient/Family education, Self Care, Electrical stimulation, Cryotherapy, Moist heat, Vasopneumatic device, Ultrasound, and Manual therapy  PLAN FOR NEXT SESSION: Please progress per protocol.    Qianna Clagett,CHRIS, PTA 07/16/2022, 1:18 PM

## 2022-07-23 ENCOUNTER — Ambulatory Visit: Payer: 59 | Attending: Orthopaedic Surgery | Admitting: *Deleted

## 2022-07-23 ENCOUNTER — Encounter: Payer: Self-pay | Admitting: *Deleted

## 2022-07-23 DIAGNOSIS — M62838 Other muscle spasm: Secondary | ICD-10-CM | POA: Diagnosis present

## 2022-07-23 DIAGNOSIS — M79651 Pain in right thigh: Secondary | ICD-10-CM | POA: Insufficient documentation

## 2022-07-23 DIAGNOSIS — M6281 Muscle weakness (generalized): Secondary | ICD-10-CM | POA: Diagnosis present

## 2022-07-23 NOTE — Therapy (Signed)
OUTPATIENT PHYSICAL THERAPY LOWER EXTREMITY TREATMENT   Patient Name: Rebecca Pearson MRN: [id number] DOB:1981/03/27, 42 y.o., female Today's Date: 07/23/2022   PT End of Session - 07/23/22 1128     Visit Number 16    Number of Visits 18    Date for PT Re-Evaluation 07/01/22    PT Start Time 1118    PT Stop Time 1205    PT Time Calculation (min) 47 min              Past Medical History:  Diagnosis Date   Atypical nevus 07/31/2012   Right Submammary   Biliary colic 0000000   Gastroesophageal reflux disease    History of colitis    History of migraine headaches    History of palpitations    no current med.   History of thrombocytopenia    only during pregnancy   PONV (postoperative nausea and vomiting)    Past Surgical History:  Procedure Laterality Date   CHOLECYSTECTOMY N/A 02/10/2017   Procedure: LAPAROSCOPIC CHOLECYSTECTOMY;  Surgeon: Rolm Bookbinder, MD;  Location: [facility];  Service: General;  Laterality: N/A;   RADIOACTIVE SEED GUIDED EXCISIONAL BREAST BIOPSY Left 05/18/2020   Procedure: LEFT RADIOACTIVE SEED GUIDED EXCISIONAL BREAST BIOPSY;  Surgeon: Rolm Bookbinder, MD;  Location: [place];  Service: General;  Laterality: Left;   SHOULDER ARTHROSCOPY WITH BICEPSTENOTOMY Right 01/30/2015   Procedure: SHOULDER ARTHROSCOPY WITH BICEPS TENOTOMY, OPEN TENODESIS,LABRAL DEBRIDEMENT;  Surgeon: Tania Ade, MD;  Location: [place];  Service: Orthopedics;  Laterality: Right;  Rigth shoulder diagnostic arthroscopy biceps tenotomy, open tenodesis, labral debridement   TONSILLECTOMY  2002   There are no problems to display for this patient.   REFERRING PROVIDER: Orvilla Fus MD  REFERRING DIAG: Right hip impingement syndrome.  THERAPY DIAG:  Pain in right thigh  Other muscle spasm  Muscle weakness (generalized)  Rationale for Evaluation and Treatment: Rehabilitation  ONSET DATE: April  2023.  SUBJECTIVE:   SUBJECTIVE STATEMENT:  Did  a lot of walking and did okay. It locked up on Me twice, but doing okay today. Not the stabbing pain  PERTINENT HISTORY: Right hip injury in April doing a backward lunge. PAIN:  Are you having pain? Yes: NPRS scale: 3/10 Pain location: Right hip. Pain description: Ache, sore. Aggravating factors: Movement. Relieving factors: Meds.  PRECAUTIONS: Other: Please see protocol.  WEIGHT BEARING RESTRICTIONS:  Patient able to full weight bear but currently wbat  with two axillary crutches.  FALLS:  Has patient fallen in last 6 months? No  LIVING ENVIRONMENT: Lives with: lives with their spouse Lives in: House/apartment Has following equipment at home: [place]: Therapist, sports.  PLOF: Independent  PATIENT GOALS: Get back to normal.  NEXT MD VISIT:   OBJECTIVE:            Today's Tx:           3-5 -24                                      RT hip  Surgery 10-27    12 weeks 06-07-22   see protocol                                    EXERCISE LOG  MD F/U 07-23-22  Exercise Repetitions and Resistance Comments  Rocker board  Calf stretching and balance x 3 mins   TM 2.3     MPH x 10 mins   Side stepping  with squat     with red tband x3-4  mins.   6in block step ups    6in side step and forward  toe touch    LAQ's    Knee ext    Knee flex   Be aware of RT HS cramping  SLS    Seated Hip ADD ball squeeze    Seated Hip ABD  isometrics Red band    Hip ext isometrics     Bridge with Ball X10   hold 10 secs   Bent knee fallouts 2x10     Clam shell  Red Tband 3x10   Prone hip IR/ER            Hooklying piriformis  stretch    Hip flexion isometrics at 90 degrees    Supine leg of mat hip stretch    Stool IR/ER     Assisted SLR    Side-lying Bent leg raise    Side-lying Clam shell    Side-lying Rev. Clam     Blank cell = exercise not performed today    Manual:   Rectus femoris  quad and hip stretching in LT  sidelying   ASSESSMENT:  CLINICAL IMPRESSION:   Pt arrived today  and did fairly well and was able to perform Therex today with mainly fatigue end of session. Focused on mat exs today for hip adductors, Abductors and glutes. Given to Pt for HEP every day.     LONG TERM GOALS: Target date: 09/17/2022   Ind with an advanced HEP per protocol. Baseline:  Goal status: Partially Met  2.  Restore normal right hip range of motion. Baseline:  Goal status: Partially Met  3.  Restore normal right LE strength to increase stability for functional tasks. Baseline:  Goal status: Partially Met  4.  Patient perform ADL's with right hip pain not > 2/10. Baseline:  Goal status: Partially Met                         5.  Resume pre-injury activities in accord with protocol guidelines. Baseline:  Goal status: On-going  PLAN:  PT FREQUENCY: 2x/week  PT DURATION: 6 weeks  PLANNED INTERVENTIONS: Therapeutic exercises, Therapeutic activity, Neuromuscular re-education, Balance training, Gait training, Patient/Family education, Self Care, Electrical stimulation, Cryotherapy, Moist heat, Vasopneumatic device, Ultrasound, and Manual therapy  PLAN FOR NEXT SESSION: Please progress per protocol.    Corianne Buccellato,CHRIS, PTA 07/23/2022, 2:15 PM

## 2022-08-01 ENCOUNTER — Ambulatory Visit: Payer: 59 | Admitting: *Deleted

## 2022-08-01 DIAGNOSIS — M79651 Pain in right thigh: Secondary | ICD-10-CM

## 2022-08-01 DIAGNOSIS — M62838 Other muscle spasm: Secondary | ICD-10-CM

## 2022-08-01 DIAGNOSIS — M6281 Muscle weakness (generalized): Secondary | ICD-10-CM

## 2022-08-01 NOTE — Therapy (Signed)
OUTPATIENT PHYSICAL THERAPY LOWER EXTREMITY TREATMENT   Patient Name: Rebecca Pearson MRN: [id number] DOB:11/04/1980, 42 y.o., female Today's Date: 08/01/2022   PT End of Session - 08/01/22 1127     Visit Number 17    Number of Visits 18    Date for PT Re-Evaluation 07/01/22    PT Start Time 1115    PT Stop Time 1205    PT Time Calculation (min) 50 min              Past Medical History:  Diagnosis Date   Atypical nevus 07/31/2012   Right Submammary   Biliary colic 0000000   Gastroesophageal reflux disease    History of colitis    History of migraine headaches    History of palpitations    no current med.   History of thrombocytopenia    only during pregnancy   PONV (postoperative nausea and vomiting)    Past Surgical History:  Procedure Laterality Date   CHOLECYSTECTOMY N/A 02/10/2017   Procedure: LAPAROSCOPIC CHOLECYSTECTOMY;  Surgeon: Rolm Bookbinder, MD;  Location: [facility];  Service: General;  Laterality: N/A;   RADIOACTIVE SEED GUIDED EXCISIONAL BREAST BIOPSY Left 05/18/2020   Procedure: LEFT RADIOACTIVE SEED GUIDED EXCISIONAL BREAST BIOPSY;  Surgeon: Rolm Bookbinder, MD;  Location: [facility];  Service: General;  Laterality: Left;   SHOULDER ARTHROSCOPY WITH BICEPSTENOTOMY Right 01/30/2015   Procedure: SHOULDER ARTHROSCOPY WITH BICEPS TENOTOMY, OPEN TENODESIS,LABRAL DEBRIDEMENT;  Surgeon: Tania Ade, MD;  Location: [place];  Service: Orthopedics;  Laterality: Right;  Rigth shoulder diagnostic arthroscopy biceps tenotomy, open tenodesis, labral debridement   TONSILLECTOMY  2002   There are no problems to display for this patient.   REFERRING PROVIDER: Orvilla Fus MD  REFERRING DIAG: Right hip impingement syndrome.  THERAPY DIAG:  Pain in right thigh  Other muscle spasm  Muscle weakness (generalized)  Rationale for Evaluation and Treatment: Rehabilitation  ONSET DATE: April  2023.  SUBJECTIVE:   SUBJECTIVE STATEMENT:  To MD tomorrow. Hip is doing better, but trying to get up out a chair after sitting a while is still very challenging  PERTINENT HISTORY: Right hip injury in April doing a backward lunge. PAIN:  Are you having pain? Yes: NPRS scale: 3/10 Pain location: Right hip. Pain description: Ache, sore. Aggravating factors: Movement. Relieving factors: Meds.  PRECAUTIONS: Other: Please see protocol.  WEIGHT BEARING RESTRICTIONS:  Patient able to full weight bear but currently wbat  with two axillary crutches.  FALLS:  Has patient fallen in last 6 months? No  LIVING ENVIRONMENT: Lives with: lives with their spouse Lives in: House/apartment Has following equipment at home: [place]: Therapist, sports.  PLOF: Independent  PATIENT GOALS: Get back to normal.  NEXT MD VISIT:   OBJECTIVE:            Today's Tx:           3-14 -24                                      RT hip  Surgery 10-27    20 weeks 08-02-22   see protocol                                    EXERCISE LOG  MD F/U 08-02-22  Exercise Repetitions and Resistance Comments  Rocker board  Calf stretching and balance x 3 mins   TM 2.3 MPH walk with 1 min jogs x 2 at 3.5 MPH x 12 mins total Aching pain lateral aspect, fatigued  Side stepping  with squat     with green tband x3-4  mins.   6in block step ups    6in side step and forward  toe touch    LAQ's    Knee ext    Knee flex   Be aware of RT HS cramping  SLS    Seated Hip ADD ball squeeze    Seated Hip ABD  isometrics Red band    Hip ext isometrics     Bridge with Ball X10   hold 10 secs   Bent knee fallouts 2x10  each   Clam shell  Red Tband 3x10   Prone hip IR/ER    SLR RT LE x 3  Full SLR, no lag      Hooklying piriformis  stretch    Hip flexion isometrics at 90 degrees    Supine leg of mat hip stretch    Stool IR/ER     Assisted SLR    Side-lying Bent leg raise    Side-lying Clam shell    Side-lying  Rev. Clam     Blank cell = exercise not performed today    Manual:   Rectus femoris  quad and hip stretching in LT sidelying   ASSESSMENT:  CLINICAL IMPRESSION:   Pt arrived today  doing fairly well with RT hip. She continues to have intermittent pain anterior aspect as well as glut pain, but glut pain has started to subside this week. She reports going from sit to stand after sitting prolonged periods is still very difficult. She did well today and was able to perform a full SLR and perform 2 1 min jog sessions on the treadmill.  She also reports LT knee pain medial aspect at times as well. No sharp glut pain today, but reports some tightness/ soreness anterolateral aspect     LONG TERM GOALS: Target date: 09/26/2022   Ind with an advanced HEP per protocol. Baseline:  Goal status: Partially Met  2.  Restore normal right hip range of motion. Baseline:  Goal status:  Met  3.  Restore normal right LE strength to increase stability for functional tasks. Baseline:  Goal status: Partially Met  4.  Patient perform ADL's with right hip pain not > 2/10. Baseline:  Goal status: Partially Met                         5.  Resume pre-injury activities in accord with protocol guidelines. Baseline:  eGoal status: On-going  PLAN:  PT FREQUENCY: 2x/week  PT DURATION: 6 weeks  PLANNED INTERVENTIONS: Therapeutic exercises, Therapeutic activity, Neuromuscular re-education, Balance training, Gait training, Patient/Family education, Self Care, Electrical stimulation, Cryotherapy, Moist heat, Vasopneumatic device, Ultrasound, and Manual therapy  PLAN FOR NEXT SESSION: Please progress per protocol. Continue as per MD   Neta Upadhyay,CHRIS, PTA 08/01/2022, 4:53 PM

## 2022-08-07 ENCOUNTER — Ambulatory Visit: Payer: 59

## 2022-08-08 ENCOUNTER — Ambulatory Visit: Payer: 59 | Admitting: *Deleted

## 2022-08-08 ENCOUNTER — Encounter: Payer: Self-pay | Admitting: *Deleted

## 2022-08-08 DIAGNOSIS — M79651 Pain in right thigh: Secondary | ICD-10-CM

## 2022-08-08 DIAGNOSIS — M6281 Muscle weakness (generalized): Secondary | ICD-10-CM

## 2022-08-08 DIAGNOSIS — M62838 Other muscle spasm: Secondary | ICD-10-CM

## 2022-08-08 NOTE — Therapy (Addendum)
OUTPATIENT PHYSICAL THERAPY LOWER EXTREMITY TREATMENT   Patient Name: Rebecca Pearson MRN: [id number] DOB:[birth year], 42 y.o., female Today's Date: 08/08/2022   PT End of Session - 08/08/22 1132     Visit Number 18    Number of Visits 18    Date for PT Re-Evaluation 07/01/22    PT Start Time 1115    PT Stop Time 1202    PT Time Calculation (min) 47 min              Past Medical History:  Diagnosis Date   Atypical nevus 07/31/2012   Right Submammary   Biliary colic 0000000   Gastroesophageal reflux disease    History of colitis    History of migraine headaches    History of palpitations    no current med.   History of thrombocytopenia    only during pregnancy   PONV (postoperative nausea and vomiting)    Past Surgical History:  Procedure Laterality Date   CHOLECYSTECTOMY N/A 02/10/2017   Procedure: LAPAROSCOPIC CHOLECYSTECTOMY;  Surgeon: Rolm Bookbinder, MD;  Location: [place];  Service: General;  Laterality: N/A;   RADIOACTIVE SEED GUIDED EXCISIONAL BREAST BIOPSY Left 05/18/2020   Procedure: LEFT RADIOACTIVE SEED GUIDED EXCISIONAL BREAST BIOPSY;  Surgeon: Rolm Bookbinder, MD;  Location: [place];  Service: General;  Laterality: Left;   SHOULDER ARTHROSCOPY WITH BICEPSTENOTOMY Right 01/30/2015   Procedure: SHOULDER ARTHROSCOPY WITH BICEPS TENOTOMY, OPEN TENODESIS,LABRAL DEBRIDEMENT;  Surgeon: Tania Ade, MD;  Location: [place];  Service: Orthopedics;  Laterality: Right;  Rigth shoulder diagnostic arthroscopy biceps tenotomy, open tenodesis, labral debridement   TONSILLECTOMY  2002   There are no problems to display for this patient.   REFERRING PROVIDER: Orvilla Fus MD  REFERRING DIAG: Right hip impingement syndrome.  THERAPY DIAG:  Pain in right thigh  Other muscle spasm  Muscle weakness (generalized)  Rationale for Evaluation and Treatment: Rehabilitation  ONSET DATE: April  2023.  SUBJECTIVE:   SUBJECTIVE STATEMENT:  To MD tomorrow. Hip is doing better, but trying to get up out a chair after sitting a while is still very challenging  PERTINENT HISTORY: Right hip injury in April doing a backward lunge. PAIN:  Are you having pain? Yes: NPRS scale: 4/10 Pain location: Right hip. Pain description: Ache, sore. Aggravating factors: Movement. Relieving factors: Meds.  PRECAUTIONS: Other: Please see protocol.  WEIGHT BEARING RESTRICTIONS:  Patient able to full weight bear but currently wbat  with two axillary crutches.  FALLS:  Has patient fallen in last 6 months? No  LIVING ENVIRONMENT: Lives with: lives with their spouse Lives in: House/apartment Has following equipment at home: [place]: Therapist, sports.  PLOF: Independent  PATIENT GOALS: Get back to normal.  NEXT MD VISIT:   OBJECTIVE:            Today's Tx:           3-19 -24                                      RT hip  Surgery 10-27    20 weeks 08-02-22   see protocol                                    EXERCISE LOG  MD F/U 08-02-22  Exercise Repetitions and Resistance Comments  Rocker board  Calf stretching and balance x 3 mins   TM 2.3 MPH walk x10 mins Aching pain lateral aspect, fatigued  Side stepping  with squat        6in block step ups    6in side step and forward  toe touch    LAQ's    Knee ext    Knee flex   Be aware of RT HS cramping  SLS    Seated Hip ADD ball squeeze    Seated Hip ABD  isometrics Red band    Hip ext isometrics     Bridge with Ball    Bent knee fallouts    Clam shell     Prone hip IR/ER    SLR   Full SLR, no lag      Hooklying piriformis  stretch    Hip flexion isometrics at 90 degrees    Supine leg of mat hip stretch    Stool IR/ER     Assisted SLR    Side-lying Bent leg raise    Side-lying Clam shell    Side-lying Rev. Clam     Blank cell = exercise not performed today    Manual:  Piriformis manual STW/TPR with pin and stretch  technique   ASSESSMENT:  CLINICAL IMPRESSION:   Pt arrived today  doing fairly well with RT hip and reports Dr was very pleased. Pt unfortunately went home started having piriformis mm spasms for about 2 days on /off. She contacted  MD and he recommended having a piriformis injection that she is still thinking about. She was able to perform treadmill and balance exs, but then manual STW/ TPR with pin and stretch was performed to piriformis. Pt wiil be DC to HEP at this time. She was able to partially meet LTGs , but unable to meet LTG for returning to pre-injury due to pain and piriformis spasms    LONG TERM GOALS: Target date: 10/03/2022   Ind with an advanced HEP per protocol. Baseline:  Goal status: Partially Met  2.  Restore normal right hip range of motion. Baseline:  Goal status:  Met  3.  Restore normal right LE strength to increase stability for functional tasks. Baseline:  Goal status: Partially Met  4.  Patient perform ADL's with right hip pain not > 2/10. Baseline:  Goal status: Partially Met                         5.  Resume pre-injury activities in accord with protocol guidelines. Baseline:  eGoal status: NM  PLAN:  PT FREQUENCY: 2x/week  PT DURATION: 6 weeks  PLANNED INTERVENTIONS: Therapeutic exercises, Therapeutic activity, Neuromuscular re-education, Balance training, Gait training, Patient/Family education, Self Care, Electrical stimulation, Cryotherapy, Moist heat, Vasopneumatic device, Ultrasound, and Manual therapy  PLAN FOR NEXT SESSION: DC to HEP   Loula Marcella,CHRIS, PTA 08/08/2022, 12:52 PM   PHYSICAL THERAPY DISCHARGE SUMMARY  Visits from Start of Care: 18.  Current functional level related to goals / functional outcomes: See above.   Remaining deficits: Piriformis muscle spasms causing patient not to meet pain goal.   Education / Equipment: HEP.   Patient agrees to discharge. Patient goals were partially met. Patient is being discharged  due to  visits completed.     [name] Applegate MPT

## 2022-10-20 IMAGING — MG MM BREAST LOCALIZATION CLIP
4 series · 4 of 12 positions shown · non-contrast
Comparison: Previous exam(s).

CLINICAL DATA: Evaluate RIBBON biopsy clip placement following
ultrasound-guided LEFT breast biopsy.

EXAM:
DIAGNOSTIC LEFT MAMMOGRAM POST ULTRASOUND BIOPSY

[L CC synth-2D]
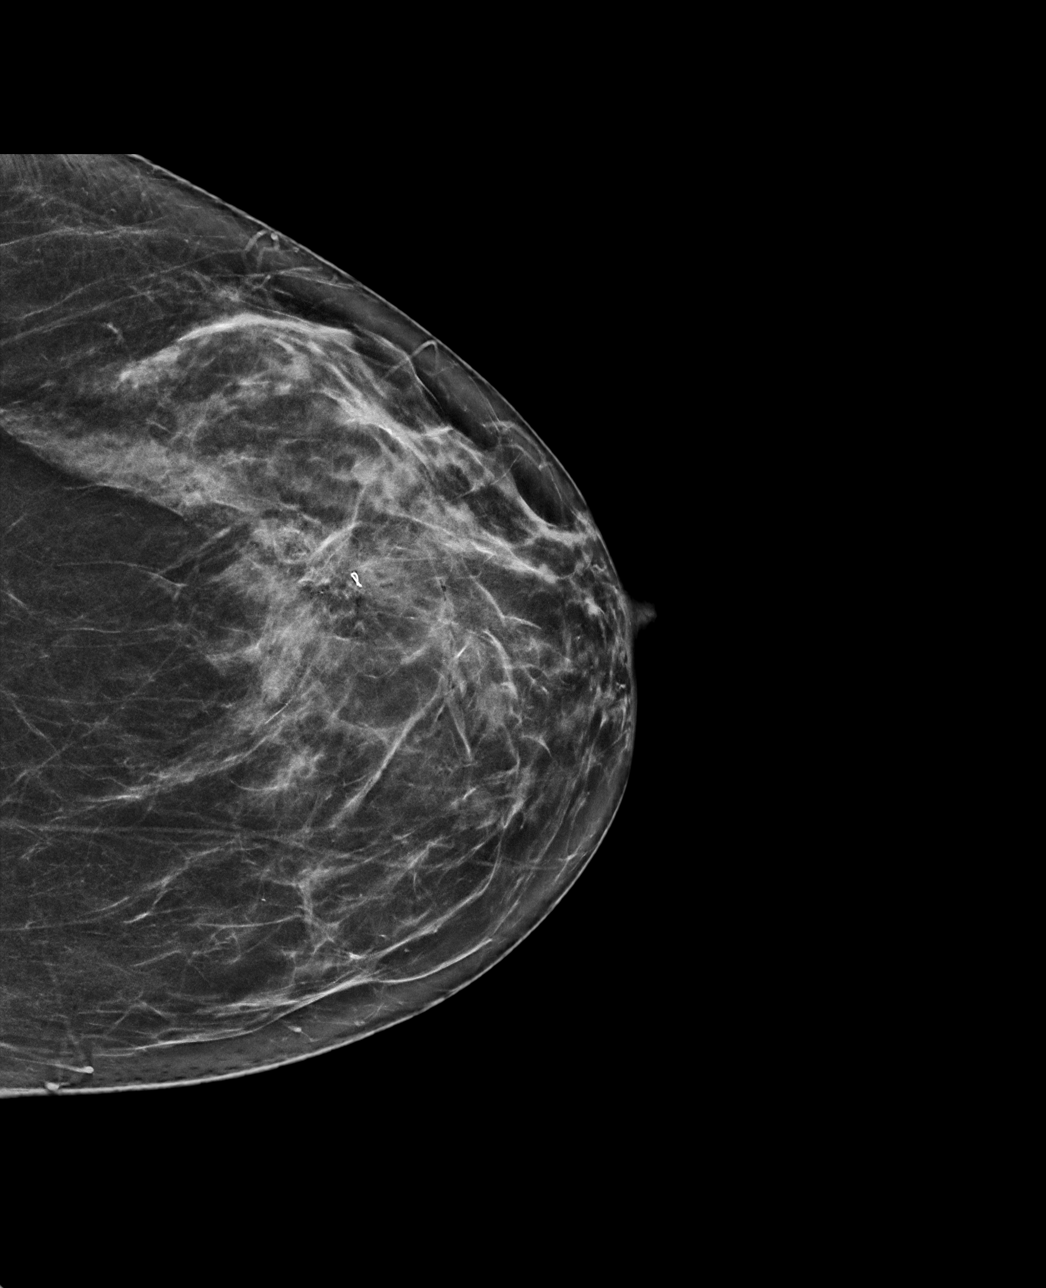

[L ML synth-2D]
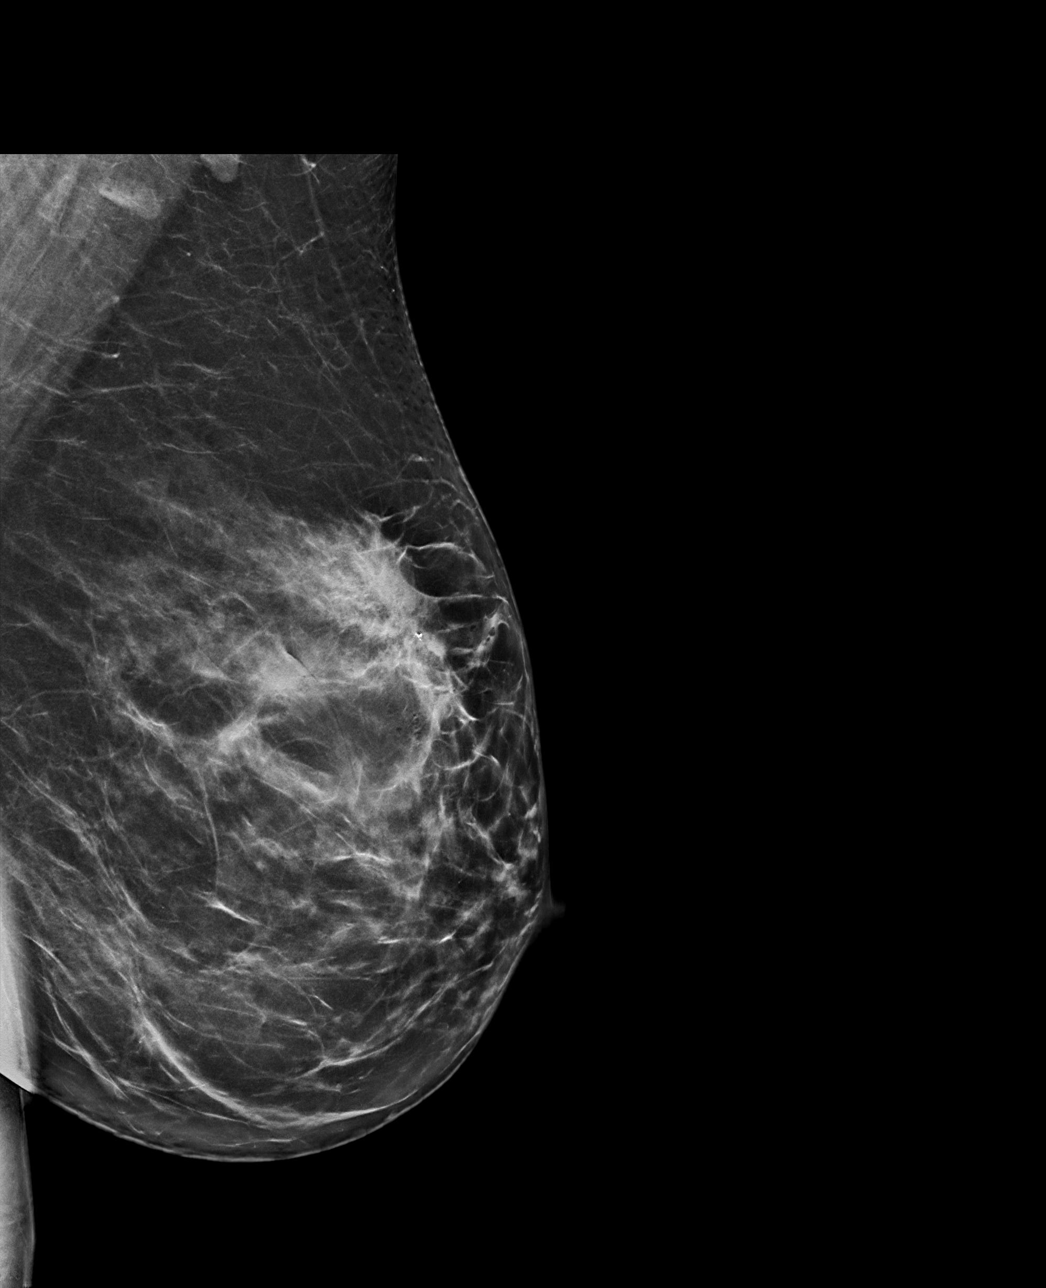

[L ML tomo · tomo slice 39/78.0]
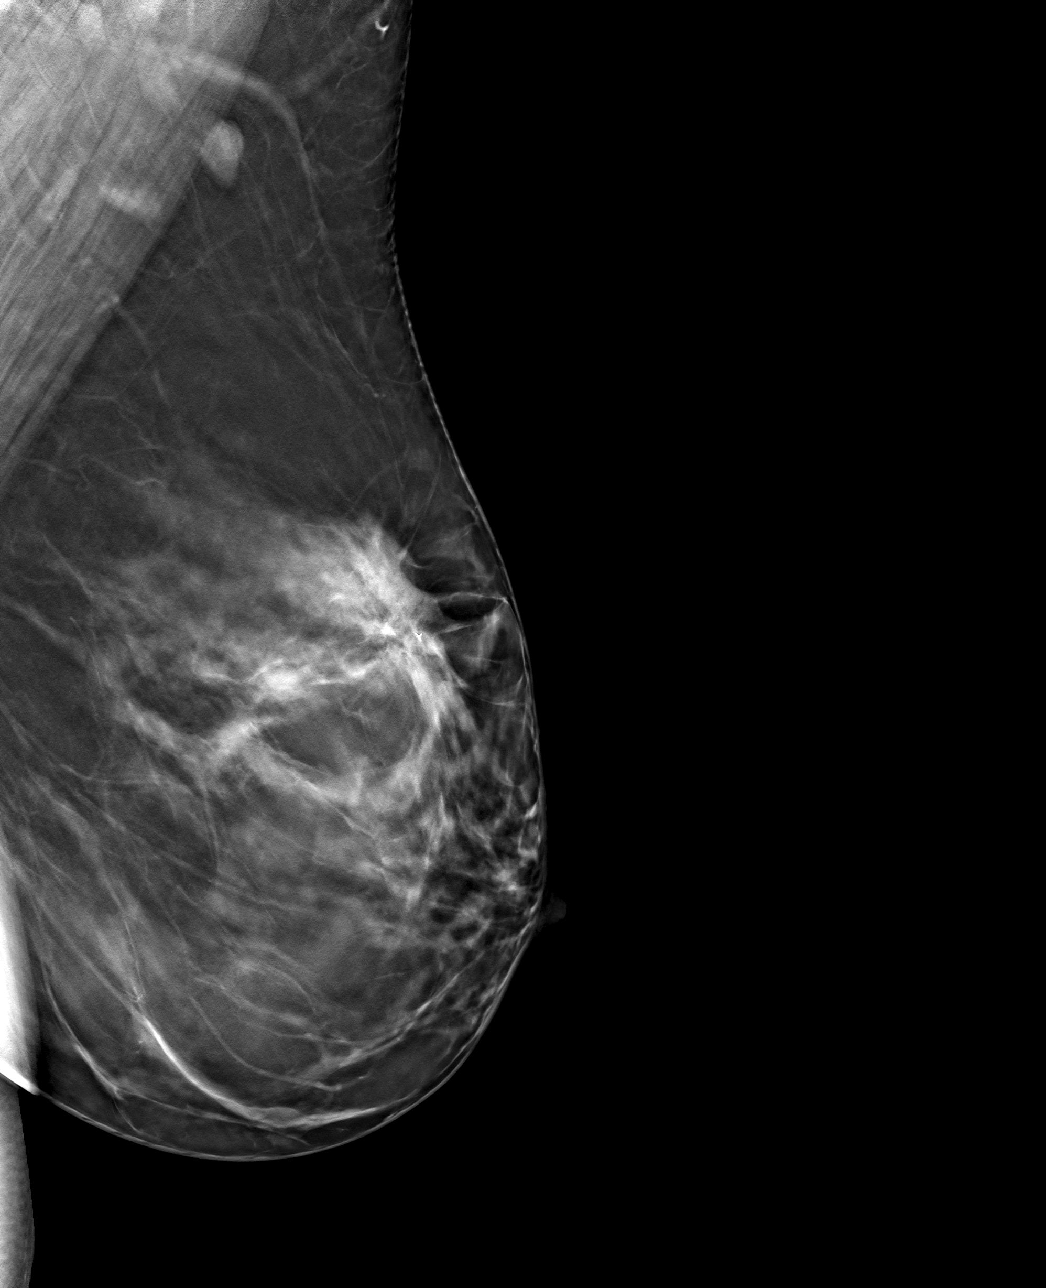

[L CC tomo · tomo slice 35/69.0]
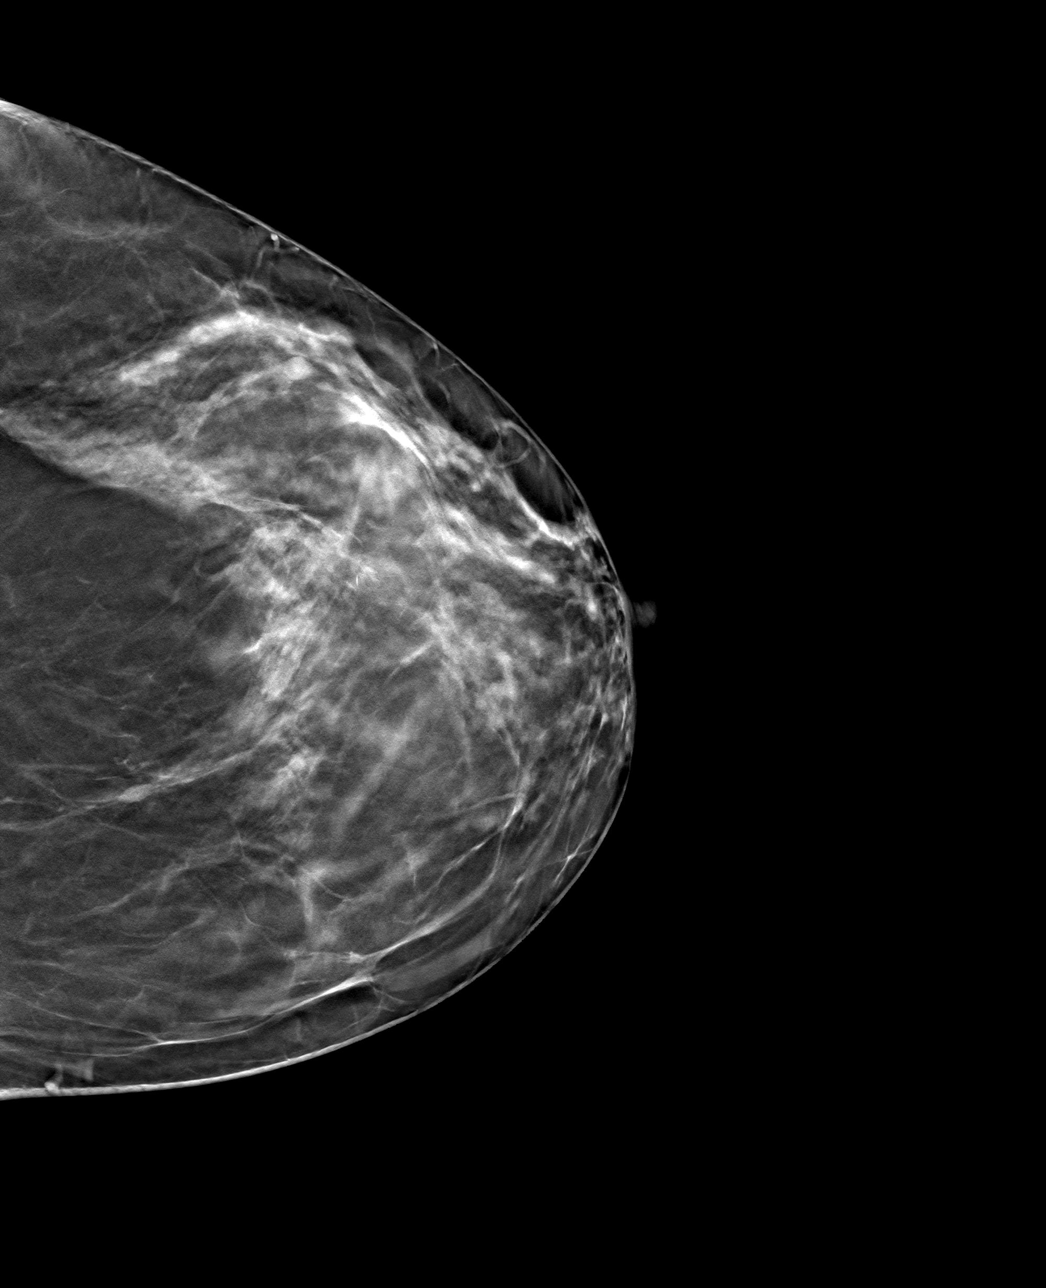

[4 of 12 positions shown; findings below may reference images not displayed]

FINDINGS: Mammographic images were obtained following ultrasound guided biopsy
of distortion at the 12 o'clock position of the LEFT breast. The
RIBBON biopsy marking clip is in expected position at the site of
biopsy.
IMPRESSION: Appropriate positioning of the RIBBON shaped biopsy marking clip at
the site of biopsy in the UPPER LEFT breast.

Final Assessment: Post Procedure Mammograms for Marker Placement

## 2022-10-20 IMAGING — US US BREAST BX W LOC DEV 1ST LESION IMG BX SPEC US GUIDE*L*
1 series · 12 of 13 positions shown · non-contrast
Comparison: Previous exam(s).
COMPARISON: Previous exam(s).

Addendum:
CLINICAL DATA: 39-year-old female for tissue sampling of UPPER LEFT
breast distortion.

EXAM:
ULTRASOUND GUIDED LEFT BREAST CORE NEEDLE BIOPSY

[Series 1: us breast bx w loc dev 1st lesion img bx spec us g · 0.06mm/px · 12 of 13 slices shown]
[im 1/13]
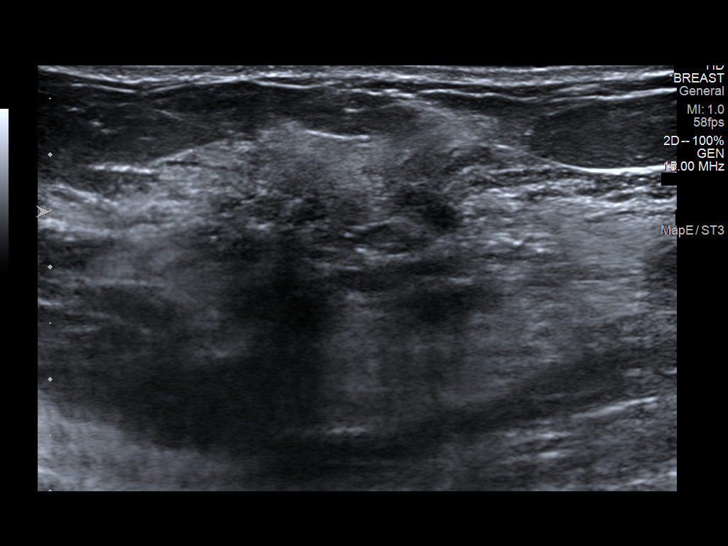
[im 2/13]
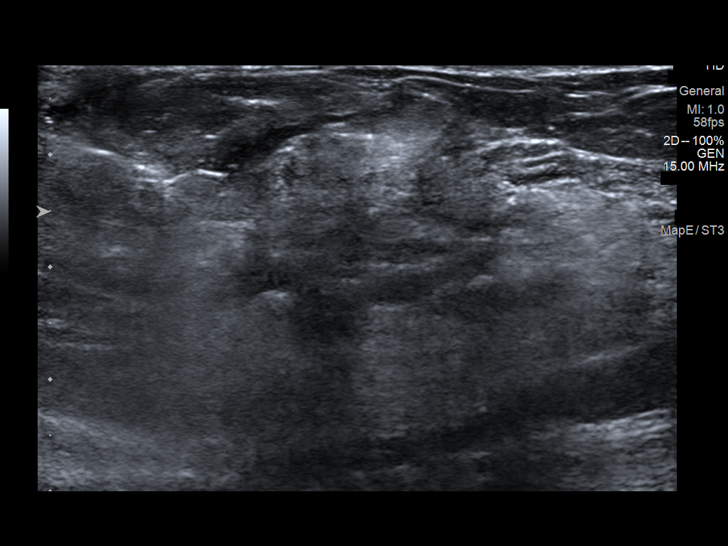
[im 3/13]
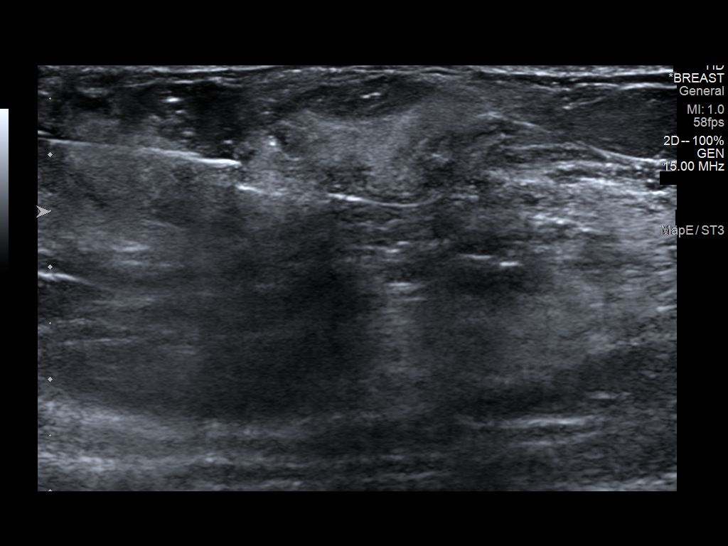
[im 4/13]
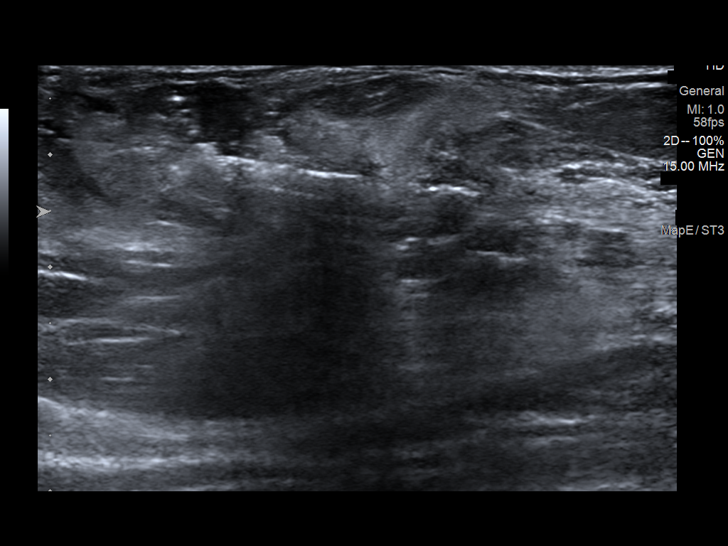
[im 5/13]
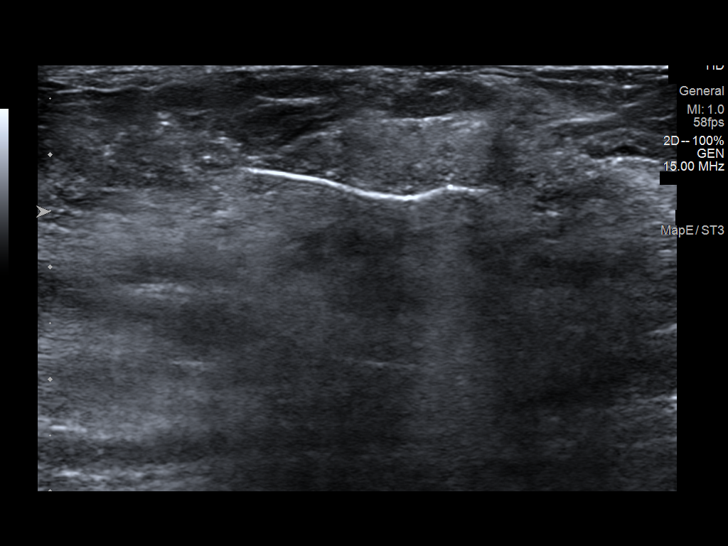
[im 6/13]
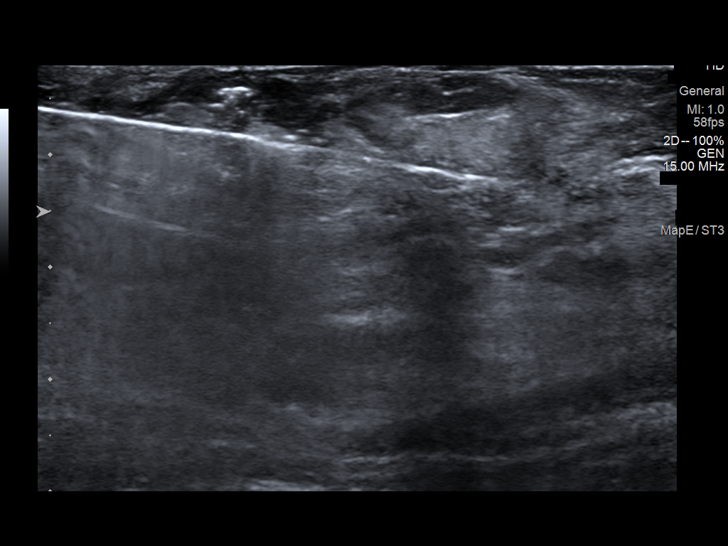
[im 8/13]
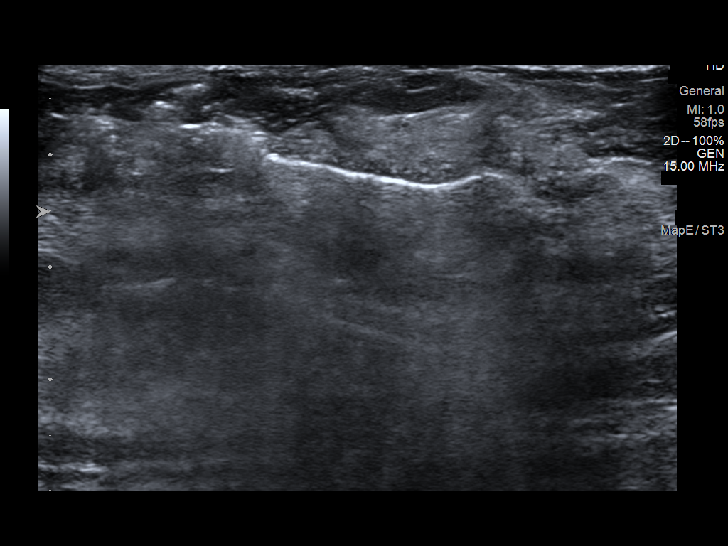
[im 9/13]
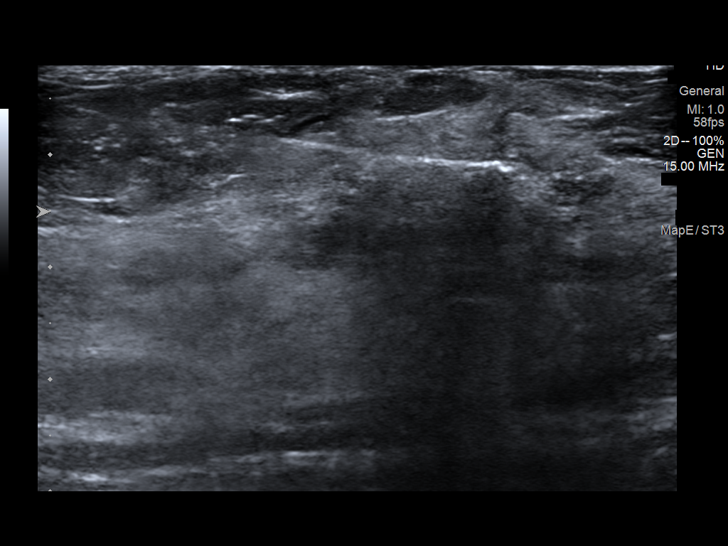
[im 10/13]
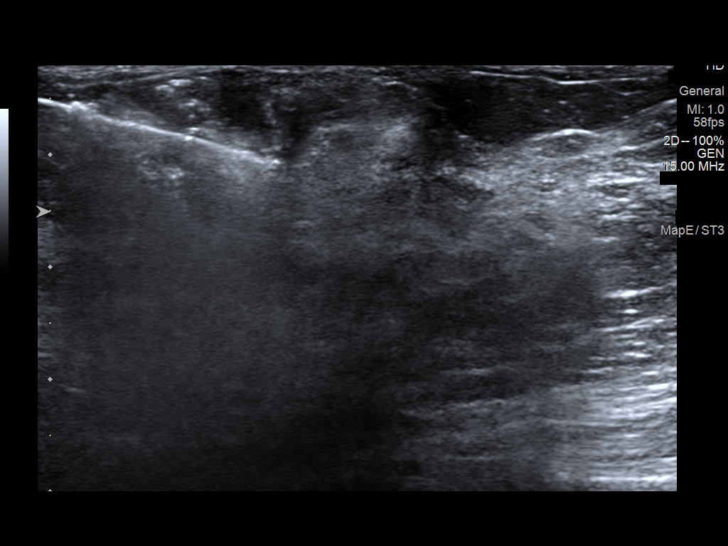
[im 11/13]
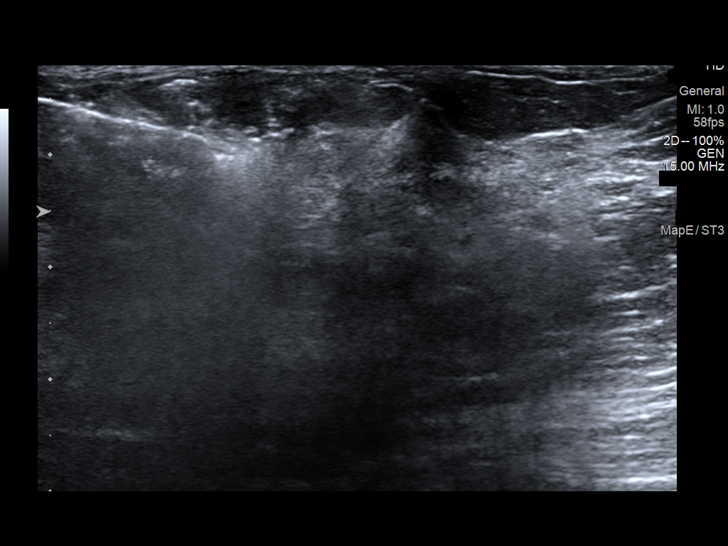
[im 12/13]
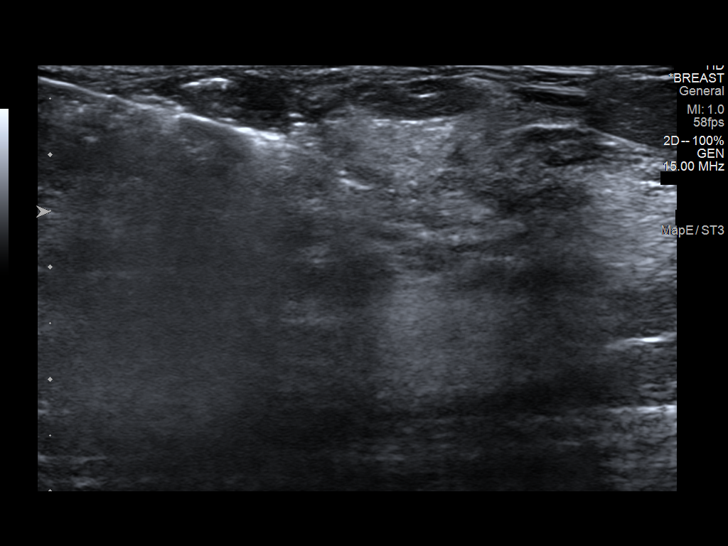
[im 13/13]
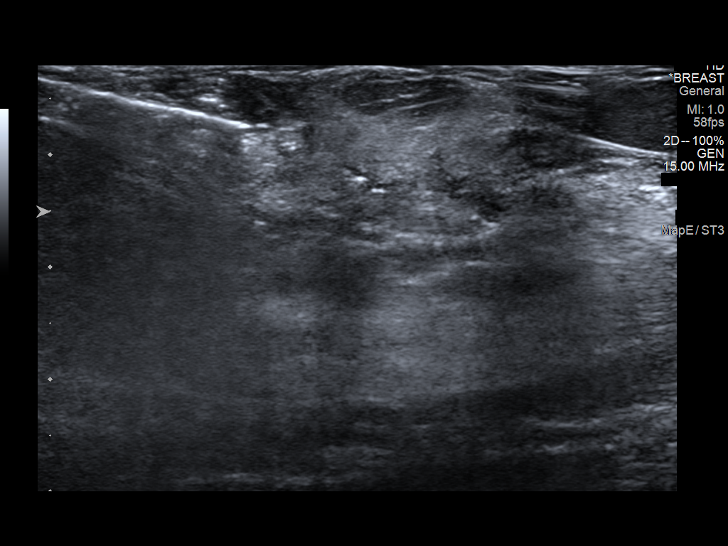

[12 of 13 positions shown; findings below may reference images not displayed]



Using sterile technique and 1% Lidocaine as local anesthetic, under
direct ultrasound visualization, a 12 gauge Bhebhe device was
used to perform biopsy of 2.2 cm area of distortion at the 12
o'clock position of the LEFT breast 3 cm from the nipple using a
MEDIAL approach. At the conclusion of the procedure a RIBBON tissue
marker clip was deployed into the biopsy cavity. Follow up 2 view
mammogram was performed and dictated separately.
IMPRESSION: Ultrasound guided biopsy of UPPER LEFT breast distortion. No
apparent complications.

ADDENDUM:
Pathology revealed COMPLEX SCLEROSING LESION WITH USUAL DUCTAL
HYPERPLASIA AND CALCIFICATIONS of the LEFT breast, 12:00 o'clock.
This was found to be concordant by Dr. Rhoel Kelemen, with excision
recommended.

Pathology results were discussed with the patient by telephone. The
patient reported doing well after the biopsy with tenderness and
bleeding at the site. Post biopsy instructions and care were
reviewed and questions were answered. The patient was encouraged to
call The [REDACTED] for any additional
concerns. My direct phone number was provided.

Surgical consultation has been arranged with Dr. Ferienhaus Erxleben,
per patient request, at [REDACTED] on June 09, 2020.

Pathology results reported by Yaret Kizer, RN on 04/26/2020.



Using sterile technique and 1% Lidocaine as local anesthetic, under
direct ultrasound visualization, a 12 gauge Bhebhe device was
used to perform biopsy of 2.2 cm area of distortion at the 12
o'clock position of the LEFT breast 3 cm from the nipple using a
MEDIAL approach. At the conclusion of the procedure a RIBBON tissue
marker clip was deployed into the biopsy cavity. Follow up 2 view
mammogram was performed and dictated separately.
IMPRESSION: Ultrasound guided biopsy of UPPER LEFT breast distortion. No
apparent complications.

## 2023-07-14 DIAGNOSIS — Z1231 Encounter for screening mammogram for malignant neoplasm of breast: Secondary | ICD-10-CM | POA: Diagnosis not present

## 2023-07-23 DIAGNOSIS — Z6836 Body mass index (BMI) 36.0-36.9, adult: Secondary | ICD-10-CM | POA: Diagnosis not present

## 2023-07-23 DIAGNOSIS — J09X2 Influenza due to identified novel influenza A virus with other respiratory manifestations: Secondary | ICD-10-CM | POA: Diagnosis not present

## 2023-11-18 DIAGNOSIS — E739 Lactose intolerance, unspecified: Secondary | ICD-10-CM | POA: Diagnosis not present

## 2023-11-18 DIAGNOSIS — K2960 Other gastritis without bleeding: Secondary | ICD-10-CM | POA: Diagnosis not present

## 2023-11-18 DIAGNOSIS — K219 Gastro-esophageal reflux disease without esophagitis: Secondary | ICD-10-CM | POA: Diagnosis not present

## 2023-11-18 DIAGNOSIS — K589 Irritable bowel syndrome without diarrhea: Secondary | ICD-10-CM | POA: Diagnosis not present

## 2023-12-25 ENCOUNTER — Ambulatory Visit: Payer: Self-pay | Admitting: Family

## 2023-12-25 ENCOUNTER — Encounter: Payer: Self-pay | Admitting: Family

## 2023-12-25 VITALS — BP 112/75 | HR 96 | Temp 97.5°F | Ht 67.0 in | Wt 155.5 lb

## 2023-12-25 DIAGNOSIS — N951 Menopausal and female climacteric states: Secondary | ICD-10-CM | POA: Insufficient documentation

## 2023-12-25 DIAGNOSIS — K219 Gastro-esophageal reflux disease without esophagitis: Secondary | ICD-10-CM | POA: Diagnosis not present

## 2023-12-25 DIAGNOSIS — R55 Syncope and collapse: Secondary | ICD-10-CM | POA: Diagnosis not present

## 2023-12-25 DIAGNOSIS — M25551 Pain in right hip: Secondary | ICD-10-CM

## 2023-12-25 DIAGNOSIS — G8929 Other chronic pain: Secondary | ICD-10-CM | POA: Insufficient documentation

## 2023-12-25 MED ORDER — ETODOLAC ER 400 MG PO TB24: 400.0000 mg | ORAL_TABLET | Freq: Two times a day (BID) | ORAL | 2 refills | Status: AC | PRN

## 2023-12-25 NOTE — Assessment & Plan Note (Signed)
 Persistent right hip pain post-surgery 2 years ago, exacerbated by activity. - Send refill of etodolac  400mg  bid prn - F/U 6 mos

## 2023-12-25 NOTE — Patient Instructions (Addendum)
 Welcome to [facility], It was a pleasure meeting you today!    As discussed, I have sent your Etodolac  refill to your pharmacy.  You can schedule a physical with fasting labs when convenient today or on MyChart.    PLEASE NOTE: If you had any LAB tests please let us  know if you have not heard back within a few days. You may see your results on MyChart before we have a chance to review them but we will give you a call once they are reviewed by us . If we ordered any REFERRALS today, please let us  know if you have not heard from their office within the next week.  Let us  know through MyChart if you are needing REFILLS, or have your pharmacy send us  the request. You can also use MyChart to communicate with me or any office staff.  Please try these tips to maintain a healthy lifestyle: It is important that you exercise regularly at least 30 minutes 5 times a week. Think about what you will eat, plan ahead. Choose whole foods, & think  clean, green, fresh or frozen over canned, processed or packaged foods which are more sugary, salty, and fatty. 70 to 75% of food eaten should be fresh vegetables and protein. 2-3  meals daily with healthy snacks between meals, but must be whole fruit, protein or vegetables. Aim to eat over a 10 hour period when you are active, for example, 7am to 5pm, and then STOP after your last meal of the day, drinking only water.  Shorter eating windows, 6-8 hours, are showing benefits in heart disease and blood sugar regulation. Drink water every day! Shoot for 64 ounces daily = 8 cups, no other drink is as healthy! Fruit juice is best enjoyed in a healthy way, by EATING the fruit.

## 2023-12-25 NOTE — Assessment & Plan Note (Signed)
 Chronic GERD with significant symptoms despite dietary modifications and high-dose pantoprazole, pepcid, carafate prn. No Barrett's esophagus. Amitriptyline trial to reduce pantoprazole dosage. - Continue amitriptyline, increase to 20 mg as planned. - Continue to follow with GI

## 2023-12-25 NOTE — Progress Notes (Signed)
 New Patient Office Visit  Subjective:  Patient ID: Rebecca Pearson, female    DOB: 1980-12-30  Age: 43 y.o. MRN: [id number]  CC:  Chief Complaint  Patient presents with   New Patient (Initial Visit)   Dizziness    Pt c/o dizziness off and on when squatting to standing. Present for 1 year,more frequent.    HPI Rebecca Pearson presents for establishing care today. Discussed the use of AI scribe software for clinical note transcription with the patient, who gave verbal consent to proceed.  History of Present Illness Rebecca Pearson is a 43 year old female with chronic GERD who presents for a follow-up on her gastrointestinal symptoms.  Gastroesophageal reflux symptoms - Chronic GERD since late teens - Persistent symptoms despite dietary modifications and high-dose pantoprazole - Recent addition of amitriptyline with ongoing symptoms - Symptoms exacerbated by sugar, orange juice, and chocolate, but not fresh oranges - Coffee limited to one cup daily; soft drinks avoided  Abdominal pain and altered bowel habits - Severe colitis episode in 2018 lasting four months, no identified cause - Since 2018, two types of abdominal pain: intense lower abdominal cramping with diarrhea and upper abdominal pain described as 'a knife being dragged across' the stomach - Symptoms have worsened over the past year, now occurring a couple of times per month - Gastroenterology provider suggested post-infectious irritable bowel syndrome - Semaglutide, started in January, may have increased symptom frequency - Colestid discontinued due to constipation  Esophageal spasms and dysphagia - History of esophageal spasms, initially suspected to be cardiac in origin - Testing did not confirm esophageal spasms - Partial relief with hyoscyamine and Carafate  Orthostatic dizziness - Transient dizziness when squatting and standing, resolves quickly  Musculoskeletal discomfort - Right hip discomfort during  prolonged standing or driving following labral repair surgery  Perimenopausal symptoms - Stress, anxiety, brain fog, and difficulty sleeping - No menstrual periods since endometrial ablation two years ago  Assessment & Plan Gastroesophageal reflux disease (GERD) Chronic GERD with significant symptoms despite dietary modifications and high-dose pantoprazole, pepcid, carafate prn. No Barrett's esophagus. Amitriptyline trial to reduce pantoprazole dosage. - Continue amitriptyline, increase to 20 mg as planned. 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 Semaglutide may exacerbate symptoms causing constipation. Current management includes hyoscyamine and colestid prn. - Continue current medication regimen. - Monitor bowel habits and adjust semaglutide dosing as needed.  Dizziness with squatting/standing (vasovagal) Intermittent dizziness likely vasovagal, as squats for long periods while gardening. Negative for orthostatic hypotension in clinic. Possibly related to semaglutide increasing heart rate. - Advise to take breaks during prolonged squatting. - Monitor symptoms and adjust activities as needed.  Right hip pain, status post labral repair Persistent right hip pain post-surgery 2 years ago, exacerbated by activity. - Send refill of etodolac  400mg  bid prn - F/U 6 mos   Perimenopausal symptoms (brain fog, mood changes, insomnia) Brain fog, mood changes, and insomnia likely due to perimenopause. Considering hormone replacement therapy, denies immediate family history of breast or ovarian cancer. - Consider hormone replacement therapy. - Discuss with gynecologist regarding management of perimenopausal symptoms.   Subjective:    Outpatient Medications Prior to Visit  Medication Sig Dispense Refill   amitriptyline (ELAVIL) 10 MG tablet Take 10 mg by mouth at  bedtime.     cetirizine (ZYRTEC) 10 MG tablet Take 10 mg by mouth daily.     colestipol (COLESTID) 1  g tablet Take 1 g by mouth 2 (two) times daily. (Patient taking differently: Take 1 g by mouth as needed (For diarrhea.).)     famotidine (PEPCID) 20 MG tablet Take 20 mg by mouth 2 (two) times daily.     hyoscyamine (LEVSIN) 0.125 MG tablet Take 0.125 mg by mouth every 4 (four) hours as needed.     melatonin 5 MG TABS Take 5 mg by mouth.     minocycline  (MINOCIN ) 100 MG capsule      pantoprazole (PROTONIX) 40 MG tablet Take 40 mg by mouth 2 (two) times daily.     SEMAGLUTIDE-WEIGHT MANAG[employer] Inject 2.75 mg into the skin once a week. Prescriber Ro online    Started 5 months ago     sucralfate (CARAFATE) 1 g tablet Take 1 g by mouth as needed (For stomach symptoms).     ibuprofen (ADVIL,MOTRIN) 200 MG tablet Take 200 mg by mouth every 6 (six) hours as needed.     Clascoterone  (WINLEVI ) 1 % CREA Apply 1 application topically daily. 60 g 3   nitroGLYCERIN (NITROSTAT) 0.4 MG SL tablet Place 0.4 mg under the tongue.     traMADol  (ULTRAM ) 50 MG tablet Take 1 tablet (50 mg total) by mouth every 6 (six) hours as needed for moderate pain or severe pain. 10 tablet 0   triamcinolone  (KENALOG ) 0.1 % Apply 1 application topically 2 (two) times daily. No for face, folds or groin 434 g 3   No facility-administered medications prior to visit.   Past Medical History:  Diagnosis Date   Allergic rhinitis 02/13/2022   Allergic rhinitis due to pollen 02/13/2022   Atypical nevus 07/31/2012   Right Submammary   Biliary colic 01/2017   Chronic cystitis 02/13/2022   Cyst of ovary 02/13/2022   Diarrhea 06/07/2022   Gastro-esophageal reflux disease without esophagitis 02/07/2000   Gastroesophageal reflux disease    History of colitis    History of migraine headaches    History of palpitations    no current med.   History of thrombocytopenia    only during pregnancy   IBS (irritable bowel syndrome)  02/06/2017   Low grade squamous intraepithelial lesion (LGSIL) on Papanicolaou smear of cervix 02/13/2022   Migraine 02/13/2022   PONV (postoperative nausea and vomiting)    Pruritus of vagina 12/07/2020   Right hip impingement syndrome 02/13/2022   Secondary dysmenorrhea 05/22/2021   Past Surgical History:  Procedure Laterality Date   ABLATION     uterine   ARTHROSCOPY, HIP, WITH LABRUM REPAIR Right    BREAST SURGERY Left 2021   CHOLECYSTECTOMY N/A 02/10/2017   Procedure: LAPAROSCOPIC CHOLECYSTECTOMY;  Surgeon: Ebbie Cough, MD;  Location: [facility];  Service: General;  Laterality: N/A;   RADIOACTIVE SEED GUIDED EXCISIONAL BREAST BIOPSY Left 05/18/2020   Procedure: LEFT RADIOACTIVE SEED GUIDED EXCISIONAL BREAST BIOPSY;  Surgeon: Ebbie Cough, MD;  Location: [facility];  Service: General;  Laterality: Left;   SHOULDER ARTHROSCOPY W/ LABRAL REPAIR Left    SHOULDER ARTHROSCOPY WITH BICEPSTENOTOMY Right 01/30/2015   Procedure: SHOULDER ARTHROSCOPY WITH BICEPS TENOTOMY, OPEN TENODESIS,LABRAL DEBRIDEMENT;  Surgeon: Eva Herring, MD;  Location: [facility];  Service: Orthopedics;  Laterality: Right;  Rigth shoulder diagnostic arthroscopy biceps tenotomy, open tenodesis, labral debridement   TONSILLECTOMY  05/20/2000    Objective:   Today's Vitals: BP 112/75 (BP Location: Left Arm, Patient Position: Sitting, Cuff Size: Normal)   Pulse 96   Temp (!) 97.5 F (36.4  C) (Temporal)   Ht 5' 7 (1.702 m)   Wt 155 lb 8 oz (70.5 kg)   LMP  (LMP Unknown)   SpO2 100%   BMI 24.35 kg/m   Physical Exam Vitals and nursing note reviewed.  Constitutional:      Appearance: Normal appearance. She is obese.  Cardiovascular:     Rate and Rhythm: Normal rate and regular rhythm.  Pulmonary:     Effort: Pulmonary effort is normal.     Breath sounds: Normal breath sounds.  Musculoskeletal:        General: Normal range of motion.  Skin:     General: Skin is warm and dry.  Neurological:     Mental Status: She is alert.  Psychiatric:        Mood and Affect: Mood normal.        Behavior: Behavior normal.    Meds ordered this encounter  Medications   etodolac  (LODINE  XL) 400 MG 24 hr tablet    Sig: Take 1 tablet (400 mg total) by mouth 2 (two) times daily as needed (Hip pain. Take after eating.).    Dispense:  60 tablet    Refill:  2    Supervising Provider:   ANDY, CAMILLE L [[id number]]    Lucius Krabbe, NP

## 2023-12-25 NOTE — Assessment & Plan Note (Signed)
 Brain fog, mood changes, and insomnia likely due to perimenopause. Considering hormone replacement therapy, denies immediate family history of breast or ovarian cancer. - Consider hormone replacement therapy. - Discuss with gynecologist regarding management of perimenopausal symptoms.

## 2023-12-26 ENCOUNTER — Telehealth: Payer: Self-pay

## 2023-12-26 NOTE — Telephone Encounter (Signed)
 Copied from CRM [id number]. Topic: Clinical - Medication Question >> Dec 26, 2023 10:46 AM Suzen RAMAN wrote: Reason for CRM: Pharmacy would like to know if its okay for patient to have the medication version of  etodolac  (LODINE  XL) 400 MG 24 hr tablet that isn't extended release. Per pharmacy they don't have the extended release version in stock.please contact pharmacy to further advise   CB# [phone]

## 2023-12-26 NOTE — Telephone Encounter (Signed)
 yes, ok to let them know non ER is fine, thanks.

## 2023-12-26 NOTE — Telephone Encounter (Signed)
 I have returned pharmacists call, Pharmacist verbalized understanding to providers RX change.

## 2024-02-19 DIAGNOSIS — K219 Gastro-esophageal reflux disease without esophagitis: Secondary | ICD-10-CM | POA: Diagnosis not present

## 2024-02-19 DIAGNOSIS — R1010 Upper abdominal pain, unspecified: Secondary | ICD-10-CM | POA: Diagnosis not present

## 2024-02-19 DIAGNOSIS — K589 Irritable bowel syndrome without diarrhea: Secondary | ICD-10-CM | POA: Diagnosis not present

## 2024-02-19 DIAGNOSIS — E739 Lactose intolerance, unspecified: Secondary | ICD-10-CM | POA: Diagnosis not present

## 2024-03-02 ENCOUNTER — Encounter: Payer: Self-pay | Admitting: Family

## 2024-03-02 ENCOUNTER — Ambulatory Visit (INDEPENDENT_AMBULATORY_CARE_PROVIDER_SITE_OTHER): Admitting: Family

## 2024-03-02 VITALS — BP 100/60 | HR 65 | Temp 97.2°F | Ht 67.0 in | Wt 147.2 lb

## 2024-03-02 DIAGNOSIS — Z1159 Encounter for screening for other viral diseases: Secondary | ICD-10-CM

## 2024-03-02 DIAGNOSIS — Z114 Encounter for screening for human immunodeficiency virus [HIV]: Secondary | ICD-10-CM | POA: Diagnosis not present

## 2024-03-02 DIAGNOSIS — Z853 Personal history of malignant neoplasm of breast: Secondary | ICD-10-CM | POA: Insufficient documentation

## 2024-03-02 DIAGNOSIS — N951 Menopausal and female climacteric states: Secondary | ICD-10-CM

## 2024-03-02 DIAGNOSIS — Z9889 Other specified postprocedural states: Secondary | ICD-10-CM | POA: Insufficient documentation

## 2024-03-02 DIAGNOSIS — Z23 Encounter for immunization: Secondary | ICD-10-CM | POA: Diagnosis not present

## 2024-03-02 DIAGNOSIS — Z0000 Encounter for general adult medical examination without abnormal findings: Secondary | ICD-10-CM | POA: Diagnosis not present

## 2024-03-02 LAB — CBC WITH DIFFERENTIAL/PLATELET
Basophils Absolute: 0 K/uL (ref 0.0–0.1)
Basophils Relative: 0.7 % (ref 0.0–3.0)
Eosinophils Absolute: 0.2 K/uL (ref 0.0–0.7)
Eosinophils Relative: 3.4 % (ref 0.0–5.0)
HCT: 38.6 % (ref 36.0–46.0)
Hemoglobin: 12.8 g/dL (ref 12.0–15.0)
Lymphocytes Relative: 45.2 % (ref 12.0–46.0)
Lymphs Abs: 2.3 K/uL (ref 0.7–4.0)
MCHC: 33.1 g/dL (ref 30.0–36.0)
MCV: 90.8 fl (ref 78.0–100.0)
Monocytes Absolute: 0.4 K/uL (ref 0.1–1.0)
Monocytes Relative: 7.8 % (ref 3.0–12.0)
Neutro Abs: 2.2 K/uL (ref 1.4–7.7)
Neutrophils Relative %: 42.9 % — ABNORMAL LOW (ref 43.0–77.0)
Platelets: 218 K/uL (ref 150.0–400.0)
RBC: 4.24 Mil/uL (ref 3.87–5.11)
RDW: 13.6 % (ref 11.5–15.5)
WBC: 5.2 K/uL (ref 4.0–10.5)

## 2024-03-02 LAB — COMPREHENSIVE METABOLIC PANEL WITH GFR
ALT: 12 U/L (ref 0–35)
AST: 14 U/L (ref 0–37)
Albumin: 4.1 g/dL (ref 3.5–5.2)
Alkaline Phosphatase: 39 U/L (ref 39–117)
BUN: 8 mg/dL (ref 6–23)
CO2: 29 meq/L (ref 19–32)
Calcium: 9.1 mg/dL (ref 8.4–10.5)
Chloride: 103 meq/L (ref 96–112)
Creatinine, Ser: 0.96 mg/dL (ref 0.40–1.20)
GFR: 71.96 mL/min (ref >60.00)
Glucose, Bld: 91 mg/dL (ref 70–99)
Potassium: 4.2 meq/L (ref 3.5–5.1)
Sodium: 138 meq/L (ref 135–145)
Total Bilirubin: 0.4 mg/dL (ref 0.2–1.2)
Total Protein: 6.3 g/dL (ref 6.0–8.3)

## 2024-03-02 LAB — LIPID PANEL
Cholesterol: 184 mg/dL (ref 0–200)
HDL: 54.1 mg/dL (ref >39.00)
LDL Cholesterol: 118 mg/dL — ABNORMAL HIGH (ref 0–99)
NonHDL: 129.47
Total CHOL/HDL Ratio: 3
Triglycerides: 56 mg/dL (ref 0.0–149.0)
VLDL: 11.2 mg/dL (ref 0.0–40.0)

## 2024-03-02 LAB — FOLLICLE STIMULATING HORMONE: FSH: 5.7 m[IU]/mL

## 2024-03-02 LAB — TSH: TSH: 3.04 u[IU]/mL (ref 0.35–5.50)

## 2024-03-02 NOTE — Patient Instructions (Signed)
 It was very nice to see you today!   I will review your lab results via MyChart in a few days.  You look great! Stay well! Exercise as able :-)  You can schedule an HRT visit at your convenience.      PLEASE NOTE:  If you had any lab tests please let us  know if you have not heard back within a few days. You may see your results on MyChart before we have a chance to review them but we will give you a call once they are reviewed by us . If we ordered any referrals today, please let us  know if you have not heard from their office within the next week.

## 2024-03-02 NOTE — Progress Notes (Signed)
 Phone [phone]  Subjective:   Patient is a 43 y.o. female presenting for annual physical.    Chief Complaint  Patient presents with   Annual Exam    Fasting w/ labs  Discussed the use of AI scribe software for clinical note transcription with the patient, who gave verbal consent to proceed.  History of Present Illness Rebecca Pearson is a 43 year old female who presents for an annual physical exam.  She experiences ongoing gastrointestinal issues that cause pain and sometimes limit her physical activities. She uses a walking pad at her desk to increase activity and performs weight exercises when symptoms allow. She had a left breast mass removed despite a negative biopsy. Pathology was clear, and her last two mammograms have been normal. She has dense breast tissue but does not require additional imaging. She has not had a menstrual period since an ablation in 2023. She is not on birth control, and her husband has had a vasectomy. She is an ex-smoker and consumes alcohol socially, less than weekly. She attends annual dermatology skin checks and is due for her next appointment in December. She had blood work done at a recent GI appointment but is unsure of the specific tests. Previous testing for HIV completed and Hepatitis C screen due to past work exposure was negative.  See problem oriented charting- ROS- full  review of systems was completed and negative except for what is noted in HPI above.  The following were reviewed and entered/updated in epic: Past Medical History:  Diagnosis Date   Allergic rhinitis 02/13/2022   Allergic rhinitis due to pollen 02/13/2022   Atypical nevus 07/31/2012   Right Submammary   Biliary colic 01/2017   Chronic cystitis 02/13/2022   Cyst of ovary 02/13/2022   Diarrhea 06/07/2022   Gastro-esophageal reflux disease without esophagitis 02/07/2000   Gastroesophageal reflux disease    History of colitis    History of migraine headaches     History of palpitations    no current med.   History of thrombocytopenia    only during pregnancy   IBS (irritable bowel syndrome) 02/06/2017   Low grade squamous intraepithelial lesion (LGSIL) on Papanicolaou smear of cervix 02/13/2022   Migraine 02/13/2022   PONV (postoperative nausea and vomiting)    Pruritus of vagina 12/07/2020   Right hip impingement syndrome 02/13/2022   Secondary dysmenorrhea 05/22/2021   Patient Active Problem List   Diagnosis Date Noted   History of left breast cancer 03/02/2024   Perimenopausal symptom 12/25/2023   Chronic right hip pain 12/25/2023   Chronic GERD 02/07/2000   Past Surgical History:  Procedure Laterality Date   ABLATION     uterine   ARTHROSCOPY, HIP, WITH LABRUM REPAIR Right    BREAST SURGERY Left 2021   CHOLECYSTECTOMY N/A 02/10/2017   Procedure: LAPAROSCOPIC CHOLECYSTECTOMY;  Surgeon: Ebbie Cough, MD;  Location: [facility];  Service: General;  Laterality: N/A;   RADIOACTIVE SEED GUIDED EXCISIONAL BREAST BIOPSY Left 05/18/2020   Procedure: LEFT RADIOACTIVE SEED GUIDED EXCISIONAL BREAST BIOPSY;  Surgeon: Ebbie Cough, MD;  Location: [facility];  Service: General;  Laterality: Left;   SHOULDER ARTHROSCOPY W/ LABRAL REPAIR Left    SHOULDER ARTHROSCOPY WITH BICEPSTENOTOMY Right 01/30/2015   Procedure: SHOULDER ARTHROSCOPY WITH BICEPS TENOTOMY, OPEN TENODESIS,LABRAL DEBRIDEMENT;  Surgeon: Eva Herring, MD;  Location: [facility];  Service: Orthopedics;  Laterality: Right;  Rigth shoulder diagnostic arthroscopy biceps tenotomy, open tenodesis, labral debridement   TONSILLECTOMY  05/20/2000    Family History  Problem Relation Age of Onset   Migraines Mother    Hypothyroidism Mother    Obesity Mother    Cancer Father        lung mets to brain, liver lympnode    GER disease Father    Irritable bowel syndrome Father    GER disease Brother    Asthma Son     Medications-  reviewed and updated Current Outpatient Medications  Medication Sig Dispense Refill   amitriptyline (ELAVIL) 10 MG tablet Take 10 mg by mouth at bedtime.     cetirizine (ZYRTEC) 10 MG tablet Take 10 mg by mouth daily.     etodolac  (LODINE  XL) 400 MG 24 hr tablet Take 1 tablet (400 mg total) by mouth 2 (two) times daily as needed (Hip pain. Take after eating.). 60 tablet 2   famotidine (PEPCID) 20 MG tablet Take 20 mg by mouth 2 (two) times daily.     hyoscyamine (LEVSIN) 0.125 MG tablet Take 0.125 mg by mouth every 4 (four) hours as needed.     melatonin 5 MG TABS Take 5 mg by mouth.     minocycline  (MINOCIN ) 100 MG capsule      pantoprazole (PROTONIX) 40 MG tablet Take 40 mg by mouth 2 (two) times daily.     sucralfate (CARAFATE) 1 g tablet Take 1 g by mouth as needed (For stomach symptoms).     colestipol (COLESTID) 1 g tablet Take 1 g by mouth 2 (two) times daily. (Patient taking differently: Take 1 g by mouth as needed (For diarrhea.).)     No current facility-administered medications for this visit.    Allergies-reviewed and updated No Known Allergies  Social History   Social History Narrative   Not on file    Objective:  BP 100/60 (BP Location: Left Arm, Patient Position: Sitting, Cuff Size: Large)   Pulse 65   Temp (!) 97.2 F (36.2 C) (Temporal)   Ht 5' 7 (1.702 m)   Wt 147 lb 3.2 oz (66.8 kg)   SpO2 95%   BMI 23.05 kg/m  Physical Exam Vitals and nursing note reviewed.  Constitutional:      Appearance: Normal appearance.  HENT:     Head: Normocephalic.     Right Ear: Tympanic membrane normal.     Left Ear: Tympanic membrane normal.     Nose: Nose normal.     Mouth/Throat:     Mouth: Mucous membranes are moist.  Eyes:     Pupils: Pupils are equal, round, and reactive to light.  Cardiovascular:     Rate and Rhythm: Normal rate and regular rhythm.  Pulmonary:     Effort: Pulmonary effort is normal.     Breath sounds: Normal breath sounds.  Musculoskeletal:         General: Normal range of motion.     Cervical back: Normal range of motion.  Lymphadenopathy:     Cervical: No cervical adenopathy.  Skin:    General: Skin is warm and dry.  Neurological:     Mental Status: She is alert.  Psychiatric:        Mood and Affect: Mood normal.        Behavior: Behavior normal.     Assessment and Plan   Health Maintenance counseling: 1. Anticipatory guidance: Patient counseled regarding regular dental exams q6 months, eye exams,  avoiding smoking and second hand smoke, limiting alcohol to 1 beverage per day, no illicit drugs.  2. Risk factor reduction:  Advised patient of need for regular exercise and diet rich with fruits and vegetables to reduce risk of heart attack and stroke.  Wt Readings from Last 3 Encounters:  03/02/24 147 lb 3.2 oz (66.8 kg)  12/25/23 155 lb 8 oz (70.5 kg)  05/18/20 193 lb 9 oz (87.8 kg)   3. Immunizations/screenings/ancillary studies Immunization History  Administered Date(s) Administered   Influenza, Seasonal, Injecte, Preservative Fre 03/02/2024   Influenza-Unspecified 01/18/2018, 02/10/2018   Moderna Sars-Covid-2 Vaccination 07/22/2019, 08/24/2019, 03/10/2020   There are no preventive care reminders to display for this patient.  4. Cervical cancer screening- done 05/2023 5. Breast cancer screening-  mammogram done 05/2023 6. Colon cancer screening - colonoscopy in 2024 7. Skin cancer screening- advised regular sunscreen use. Denies worrisome, changing, or new skin lesions. Sees [facility]ER[facility] regularly. 8. Birth control/STD check- none 9. Osteoporosis screening- N/A 10. Alcohol screening:  social; 11. Smoking associated screening (lung cancer screening, AAA screen 65-75, UA)- ex- smoker - <15 years.  12. Exercise - has walking pad at home while working Assessment & Plan Adult Wellness Visit Routine wellness visit with no skin or dental concerns. Discussed exercise benefits and reviewed lifestyle habits. Pap smear and  mammogram completed earlier in year. - Perform CBC, CMP, thyroid, and lipid panel. - Provide preventative information summary. - Encourage exercise up to 20 minutes as many days as possible.  Peri-menopausal symptoms Evaluated menopausal symptoms including cognitive and sleep disturbances. Discussed menopause and HRT. - Order [facility] test. - Schedule follow-up for [facility] results and HRT discussion.  Left breast mass excision (benign) Benign excision with negative biopsy. Family history noted but no immediate risk. - Continue annual mammograms.  General Health Maintenance Flu shot given. Discussed COVID-19 vaccination for high-risk individuals. - Administer flu shot. - Discuss COVID-19 vaccination options and provide prescription if needed.   Recommended follow up:  Return return to discuss HRT, for return to discuss HRT. No future appointments.  Lab/Order associations: fasting  Lucius Krabbe, NP

## 2024-03-04 ENCOUNTER — Ambulatory Visit: Payer: Self-pay | Admitting: Family

## 2024-03-11 ENCOUNTER — Encounter: Payer: Self-pay | Admitting: Family

## 2024-04-02 ENCOUNTER — Ambulatory Visit: Admitting: Family
